# Patient Record
Sex: Female | Born: 1995
Health system: Southern US, Community
[De-identification: ages and names within clinical notes are randomized; demographics above are authoritative.]

## PROBLEM LIST (undated history)

## (undated) DIAGNOSIS — J020 Streptococcal pharyngitis: Secondary | ICD-10-CM

## (undated) DIAGNOSIS — S069X9A Unspecified intracranial injury with loss of consciousness of unspecified duration, initial encounter: Secondary | ICD-10-CM

## (undated) DIAGNOSIS — K219 Gastro-esophageal reflux disease without esophagitis: Secondary | ICD-10-CM

## (undated) DIAGNOSIS — F319 Bipolar disorder, unspecified: Secondary | ICD-10-CM

## (undated) DIAGNOSIS — F32A Depression, unspecified: Secondary | ICD-10-CM

## (undated) DIAGNOSIS — F419 Anxiety disorder, unspecified: Secondary | ICD-10-CM

## (undated) HISTORY — DX: Streptococcal pharyngitis: J02.0

## (undated) HISTORY — DX: Unspecified intracranial injury with loss of consciousness of unspecified duration, initial encounter: S06.9X9A

---
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
## 2016-01-04 ENCOUNTER — Other Ambulatory Visit: Payer: Self-pay | Admitting: Family Medicine

## 2016-01-08 ENCOUNTER — Ambulatory Visit
Admission: RE | Admit: 2016-01-08 | Discharge: 2016-01-08 | Disposition: A | Discharge status: Home / Self Care | Payer: Commercial Managed Care - HMO | Source: Ambulatory Visit | Attending: Family Medicine | Admitting: Family Medicine

## 2016-01-08 ENCOUNTER — Ambulatory Visit (INDEPENDENT_AMBULATORY_CARE_PROVIDER_SITE_OTHER): Payer: Commercial Managed Care - HMO | Admitting: Family Medicine

## 2016-01-08 ENCOUNTER — Encounter: Payer: Self-pay | Admitting: Family Medicine

## 2016-01-08 VITALS — BP 104/70 | HR 72 | Temp 98.7°F | Ht 69.0 in | Wt 144.4 lb

## 2016-01-08 DIAGNOSIS — N6320 Unspecified lump in the left breast, unspecified quadrant: Secondary | ICD-10-CM | POA: Insufficient documentation

## 2016-01-08 DIAGNOSIS — N63 Unspecified lump in breast: Secondary | ICD-10-CM | POA: Diagnosis not present

## 2016-01-08 NOTE — Assessment & Plan Note (Signed)
New.  Pt w/ firm, ~1 cm mobile lump in L breast at 12 o'clock at areolar margin.  Get imaging to assess.  Will determine next steps based on results.

## 2016-01-08 NOTE — Progress Notes (Signed)
   Subjective:    Patient ID: Kylie Osborn, female    DOB: Nov 05, 1996, 20 y.o.   MRN: [id number]  HPI Breast lump- L breast.  Noticed last week.  Not painful.  No color change to skin, no drainage.  + family history of breast cancer- maternal aunt (late 71s), paternal aunt (89s).   Review of Systems For ROS see HPI     Objective:   Physical Exam  Constitutional: She is oriented to person, place, and time. She appears well-developed and well-nourished. No distress.  HENT:  Head: Normocephalic and atraumatic.  Pulmonary/Chest: Right breast exhibits no inverted nipple, no mass, no nipple discharge, no skin change and no tenderness. Left breast exhibits mass (1cm firm but freely mobile mass at 12 o'clock at areolar border). Left breast exhibits no inverted nipple, no nipple discharge, no skin change and no tenderness.    Neurological: She is alert and oriented to person, place, and time.  Skin: Skin is warm and dry.  Psychiatric: She has a normal mood and affect. Her behavior is normal. Thought content normal.  Vitals reviewed.         Assessment & Plan:

## 2016-01-08 NOTE — Patient Instructions (Signed)
We'll notify you of your results as soon as we have them [facility] Call with any questions or concerns Hang in there!!

## 2016-02-01 ENCOUNTER — Ambulatory Visit (INDEPENDENT_AMBULATORY_CARE_PROVIDER_SITE_OTHER): Payer: Commercial Managed Care - HMO | Admitting: Family Medicine

## 2016-02-01 ENCOUNTER — Encounter: Payer: Self-pay | Admitting: Family Medicine

## 2016-02-01 VITALS — BP 100/68 | HR 68 | Temp 98.1°F | Resp 16 | Ht 69.0 in | Wt 143.4 lb

## 2016-02-01 DIAGNOSIS — Z0000 Encounter for general adult medical examination without abnormal findings: Secondary | ICD-10-CM | POA: Diagnosis not present

## 2016-02-01 DIAGNOSIS — Z202 Contact with and (suspected) exposure to infections with a predominantly sexual mode of transmission: Secondary | ICD-10-CM

## 2016-02-01 MED ORDER — NORGESTIMATE-ETH ESTRADIOL 0.25-35 MG-MCG PO TABS: 1.0000 | ORAL_TABLET | Freq: Every day | ORAL | Status: DC

## 2016-02-01 NOTE — Progress Notes (Signed)
Pre visit review using our clinic review tool, if applicable. No additional management support is needed unless otherwise documented below in the visit note. 

## 2016-02-01 NOTE — Assessment & Plan Note (Signed)
Check labs.  Treat prn.  Stressed need for safe sex.

## 2016-02-01 NOTE — Patient Instructions (Signed)
Follow up in 1 year or as needed We'll notify you of your lab results and make any changes if needed Keep up the good work!  You look great! Call with any questions or concerns If you want to join us at the new [place] office, any scheduled appointments will automatically transfer and we will see you at [address], [place], [place] [id number] (OPENING 3/23) Keep up the good work in school! Enjoy Spring Break!!! Happy [birth year] Birthday!!!

## 2016-02-01 NOTE — Assessment & Plan Note (Signed)
Pt's PE WNL.  UTD on immunizations.  Discussed safety at school and home.  Anticipatory guidance provided.

## 2016-02-01 NOTE — Progress Notes (Signed)
   Subjective:    Patient ID: Kylie Osborn, female    DOB: 02/22/1996, 20 y.o.   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  Getting good grades.  No current concerns.  Pt asking to be screened for STDs.     Review of Systems Patient reports no vision/ hearing changes, adenopathy,fever, weight change,  persistant/recurrent hoarseness , swallowing issues, chest pain, palpitations, edema, persistant/recurrent cough, hemoptysis, dyspnea (rest/exertional/paroxysmal nocturnal), gastrointestinal bleeding (melena, rectal bleeding), abdominal pain, significant heartburn, bowel changes, GU symptoms (dysuria, hematuria, incontinence), Gyn symptoms (abnormal  bleeding, pain),  syncope, focal weakness, memory loss, numbness & tingling, skin/hair/nail changes, abnormal bruising or bleeding, anxiety, or depression.     Objective:   Physical Exam General Appearance:    Alert, cooperative, no distress, appears stated age  Head:    Normocephalic, without obvious abnormality, atraumatic  Eyes:    PERRL, conjunctiva/corneas clear, EOM's intact, fundi    benign, both eyes  Ears:    Normal TM's and external ear canals, both ears  Nose:   Nares normal, septum midline, mucosa normal, no drainage    or sinus tenderness  Throat:   Lips, mucosa, and tongue normal; teeth and gums normal  Neck:   Supple, symmetrical, trachea midline, no adenopathy;    Thyroid: no enlargement/tenderness/nodules  Back:     Symmetric, no curvature, ROM normal, no CVA tenderness  Lungs:     Clear to auscultation bilaterally, respirations unlabored  Chest Wall:    No tenderness or deformity   Heart:    Regular rate and rhythm, S1 and S2 normal, no murmur, rub   or gallop  Breast Exam:    Deferred to GYN  Abdomen:     Soft, non-tender, bowel sounds active all four quadrants,    no masses, no organomegaly  Genitalia:    Deferred to GYN  Rectal:    Extremities:   Extremities normal, atraumatic, no cyanosis or edema  Pulses:   2+  and symmetric all extremities  Skin:   Skin color, texture, turgor normal, no rashes or lesions  Lymph nodes:   Cervical, supraclavicular, and axillary nodes normal  Neurologic:   CNII-XII intact, normal strength, sensation and reflexes    throughout          Assessment & Plan:

## 2016-02-02 LAB — HIV ANTIBODY (ROUTINE TESTING W REFLEX): HIV: NONREACTIVE

## 2016-02-02 LAB — GC/CHLAMYDIA PROBE AMP
CT PROBE, AMP APTIMA: NOT DETECTED
GC Probe RNA: NOT DETECTED

## 2016-02-02 LAB — RPR

## 2016-11-13 ENCOUNTER — Other Ambulatory Visit: Payer: Self-pay | Admitting: Family Medicine

## 2017-04-06 ENCOUNTER — Emergency Department (HOSPITAL_COMMUNITY): Payer: Commercial Managed Care - HMO

## 2017-04-06 ENCOUNTER — Encounter (HOSPITAL_COMMUNITY): Admission: EM | Disposition: A | Discharge status: Rehabilitation Facility | Payer: Self-pay | Source: Home / Self Care

## 2017-04-06 ENCOUNTER — Inpatient Hospital Stay (HOSPITAL_COMMUNITY): Payer: Commercial Managed Care - HMO

## 2017-04-06 ENCOUNTER — Inpatient Hospital Stay (HOSPITAL_COMMUNITY): Payer: Commercial Managed Care - HMO | Admitting: Certified Registered Nurse Anesthetist

## 2017-04-06 ENCOUNTER — Encounter (HOSPITAL_COMMUNITY): Payer: Self-pay | Admitting: Radiology

## 2017-04-06 ENCOUNTER — Inpatient Hospital Stay (HOSPITAL_COMMUNITY)
Admission: EM | Admit: 2017-04-06 | Discharge: 2017-04-18 | DRG: 003 | Disposition: A | Discharge status: Rehabilitation Facility | Payer: Commercial Managed Care - HMO | Attending: Orthopedic Surgery | Admitting: Orthopedic Surgery

## 2017-04-06 DIAGNOSIS — S52209A Unspecified fracture of shaft of unspecified ulna, initial encounter for closed fracture: Secondary | ICD-10-CM | POA: Diagnosis present

## 2017-04-06 DIAGNOSIS — T148XXA Other injury of unspecified body region, initial encounter: Secondary | ICD-10-CM

## 2017-04-06 DIAGNOSIS — R140 Abdominal distension (gaseous): Secondary | ICD-10-CM | POA: Diagnosis not present

## 2017-04-06 DIAGNOSIS — S064X0A Epidural hemorrhage without loss of consciousness, initial encounter: Secondary | ICD-10-CM | POA: Diagnosis not present

## 2017-04-06 DIAGNOSIS — R1310 Dysphagia, unspecified: Secondary | ICD-10-CM | POA: Diagnosis not present

## 2017-04-06 DIAGNOSIS — J9690 Respiratory failure, unspecified, unspecified whether with hypoxia or hypercapnia: Secondary | ICD-10-CM | POA: Diagnosis not present

## 2017-04-06 DIAGNOSIS — S62301D Unspecified fracture of second metacarpal bone, left hand, subsequent encounter for fracture with routine healing: Secondary | ICD-10-CM | POA: Diagnosis not present

## 2017-04-06 DIAGNOSIS — S199XXA Unspecified injury of neck, initial encounter: Secondary | ICD-10-CM | POA: Diagnosis not present

## 2017-04-06 DIAGNOSIS — J9601 Acute respiratory failure with hypoxia: Secondary | ICD-10-CM | POA: Diagnosis present

## 2017-04-06 DIAGNOSIS — S62307A Unspecified fracture of fifth metacarpal bone, left hand, initial encounter for closed fracture: Secondary | ICD-10-CM | POA: Diagnosis present

## 2017-04-06 DIAGNOSIS — S61411A Laceration without foreign body of right hand, initial encounter: Secondary | ICD-10-CM | POA: Diagnosis not present

## 2017-04-06 DIAGNOSIS — D62 Acute posthemorrhagic anemia: Secondary | ICD-10-CM | POA: Diagnosis not present

## 2017-04-06 DIAGNOSIS — S12201A Unspecified nondisplaced fracture of third cervical vertebra, initial encounter for closed fracture: Secondary | ICD-10-CM | POA: Diagnosis not present

## 2017-04-06 DIAGNOSIS — S129XXA Fracture of neck, unspecified, initial encounter: Secondary | ICD-10-CM

## 2017-04-06 DIAGNOSIS — S0101XA Laceration without foreign body of scalp, initial encounter: Secondary | ICD-10-CM

## 2017-04-06 DIAGNOSIS — R0902 Hypoxemia: Secondary | ICD-10-CM | POA: Diagnosis not present

## 2017-04-06 DIAGNOSIS — S6292XA Unspecified fracture of left wrist and hand, initial encounter for closed fracture: Secondary | ICD-10-CM | POA: Diagnosis not present

## 2017-04-06 DIAGNOSIS — I809 Phlebitis and thrombophlebitis of unspecified site: Secondary | ICD-10-CM | POA: Diagnosis not present

## 2017-04-06 DIAGNOSIS — S22029A Unspecified fracture of second thoracic vertebra, initial encounter for closed fracture: Secondary | ICD-10-CM | POA: Diagnosis not present

## 2017-04-06 DIAGNOSIS — S12290A Other displaced fracture of third cervical vertebra, initial encounter for closed fracture: Secondary | ICD-10-CM | POA: Diagnosis not present

## 2017-04-06 DIAGNOSIS — S12190A Other displaced fracture of second cervical vertebra, initial encounter for closed fracture: Secondary | ICD-10-CM | POA: Diagnosis not present

## 2017-04-06 DIAGNOSIS — S62231A Other displaced fracture of base of first metacarpal bone, right hand, initial encounter for closed fracture: Secondary | ICD-10-CM | POA: Diagnosis not present

## 2017-04-06 DIAGNOSIS — M79641 Pain in right hand: Secondary | ICD-10-CM | POA: Diagnosis not present

## 2017-04-06 DIAGNOSIS — S270XXA Traumatic pneumothorax, initial encounter: Secondary | ICD-10-CM | POA: Diagnosis present

## 2017-04-06 DIAGNOSIS — F419 Anxiety disorder, unspecified: Secondary | ICD-10-CM | POA: Diagnosis present

## 2017-04-06 DIAGNOSIS — J939 Pneumothorax, unspecified: Secondary | ICD-10-CM

## 2017-04-06 DIAGNOSIS — S62317D Displaced fracture of base of fifth metacarpal bone. left hand, subsequent encounter for fracture with routine healing: Secondary | ICD-10-CM | POA: Diagnosis not present

## 2017-04-06 DIAGNOSIS — S6291XA Unspecified fracture of right wrist and hand, initial encounter for closed fracture: Secondary | ICD-10-CM

## 2017-04-06 DIAGNOSIS — Z4682 Encounter for fitting and adjustment of non-vascular catheter: Secondary | ICD-10-CM | POA: Diagnosis not present

## 2017-04-06 DIAGNOSIS — S3993XA Unspecified injury of pelvis, initial encounter: Secondary | ICD-10-CM | POA: Diagnosis not present

## 2017-04-06 DIAGNOSIS — J398 Other specified diseases of upper respiratory tract: Secondary | ICD-10-CM

## 2017-04-06 DIAGNOSIS — J9811 Atelectasis: Secondary | ICD-10-CM

## 2017-04-06 DIAGNOSIS — T1490XA Injury, unspecified, initial encounter: Secondary | ICD-10-CM

## 2017-04-06 DIAGNOSIS — S0091XA Abrasion of unspecified part of head, initial encounter: Secondary | ICD-10-CM | POA: Diagnosis not present

## 2017-04-06 DIAGNOSIS — M4322 Fusion of spine, cervical region: Secondary | ICD-10-CM | POA: Diagnosis not present

## 2017-04-06 DIAGNOSIS — N6320 Unspecified lump in the left breast, unspecified quadrant: Secondary | ICD-10-CM | POA: Diagnosis not present

## 2017-04-06 DIAGNOSIS — S27321A Contusion of lung, unilateral, initial encounter: Secondary | ICD-10-CM | POA: Diagnosis not present

## 2017-04-06 DIAGNOSIS — S299XXA Unspecified injury of thorax, initial encounter: Secondary | ICD-10-CM

## 2017-04-06 DIAGNOSIS — S14102A Unspecified injury at C2 level of cervical spinal cord, initial encounter: Secondary | ICD-10-CM | POA: Diagnosis not present

## 2017-04-06 DIAGNOSIS — S63056A Dislocation of other carpometacarpal joint of unspecified hand, initial encounter: Secondary | ICD-10-CM

## 2017-04-06 DIAGNOSIS — S2232XA Fracture of one rib, left side, initial encounter for closed fracture: Secondary | ICD-10-CM | POA: Diagnosis not present

## 2017-04-06 DIAGNOSIS — J9600 Acute respiratory failure, unspecified whether with hypoxia or hypercapnia: Secondary | ICD-10-CM | POA: Diagnosis not present

## 2017-04-06 DIAGNOSIS — S15101A Unspecified injury of right vertebral artery, initial encounter: Secondary | ICD-10-CM | POA: Diagnosis not present

## 2017-04-06 DIAGNOSIS — J020 Streptococcal pharyngitis: Secondary | ICD-10-CM | POA: Diagnosis not present

## 2017-04-06 DIAGNOSIS — S12200D Unspecified displaced fracture of third cervical vertebra, subsequent encounter for fracture with routine healing: Secondary | ICD-10-CM | POA: Diagnosis not present

## 2017-04-06 DIAGNOSIS — M795 Residual foreign body in soft tissue: Secondary | ICD-10-CM | POA: Diagnosis not present

## 2017-04-06 DIAGNOSIS — R918 Other nonspecific abnormal finding of lung field: Secondary | ICD-10-CM | POA: Diagnosis not present

## 2017-04-06 DIAGNOSIS — R102 Pelvic and perineal pain: Secondary | ICD-10-CM | POA: Diagnosis not present

## 2017-04-06 DIAGNOSIS — S0102XA Laceration with foreign body of scalp, initial encounter: Secondary | ICD-10-CM | POA: Diagnosis not present

## 2017-04-06 DIAGNOSIS — S62308A Unspecified fracture of other metacarpal bone, initial encounter for closed fracture: Secondary | ICD-10-CM | POA: Diagnosis present

## 2017-04-06 DIAGNOSIS — S52502A Unspecified fracture of the lower end of left radius, initial encounter for closed fracture: Secondary | ICD-10-CM | POA: Diagnosis not present

## 2017-04-06 DIAGNOSIS — S0990XA Unspecified injury of head, initial encounter: Secondary | ICD-10-CM | POA: Diagnosis not present

## 2017-04-06 DIAGNOSIS — G8250 Quadriplegia, unspecified: Secondary | ICD-10-CM | POA: Diagnosis present

## 2017-04-06 DIAGNOSIS — S3992XA Unspecified injury of lower back, initial encounter: Secondary | ICD-10-CM | POA: Diagnosis not present

## 2017-04-06 DIAGNOSIS — S3991XA Unspecified injury of abdomen, initial encounter: Secondary | ICD-10-CM | POA: Diagnosis not present

## 2017-04-06 DIAGNOSIS — R0603 Acute respiratory distress: Secondary | ICD-10-CM | POA: Diagnosis not present

## 2017-04-06 DIAGNOSIS — S63251A Unspecified dislocation of left index finger, initial encounter: Secondary | ICD-10-CM | POA: Diagnosis not present

## 2017-04-06 DIAGNOSIS — S51812A Laceration without foreign body of left forearm, initial encounter: Secondary | ICD-10-CM | POA: Diagnosis present

## 2017-04-06 DIAGNOSIS — S069X9A Unspecified intracranial injury with loss of consciousness of unspecified duration, initial encounter: Secondary | ICD-10-CM

## 2017-04-06 DIAGNOSIS — S61412A Laceration without foreign body of left hand, initial encounter: Secondary | ICD-10-CM | POA: Diagnosis not present

## 2017-04-06 DIAGNOSIS — R40242 Glasgow coma scale score 9-12, unspecified time: Secondary | ICD-10-CM | POA: Diagnosis present

## 2017-04-06 DIAGNOSIS — S5292XD Unspecified fracture of left forearm, subsequent encounter for closed fracture with routine healing: Secondary | ICD-10-CM | POA: Diagnosis not present

## 2017-04-06 DIAGNOSIS — S12100A Unspecified displaced fracture of second cervical vertebra, initial encounter for closed fracture: Secondary | ICD-10-CM | POA: Diagnosis not present

## 2017-04-06 DIAGNOSIS — G8252 Quadriplegia, C1-C4 incomplete: Secondary | ICD-10-CM | POA: Diagnosis not present

## 2017-04-06 DIAGNOSIS — S069XAA Unspecified intracranial injury with loss of consciousness status unknown, initial encounter: Secondary | ICD-10-CM

## 2017-04-06 DIAGNOSIS — J90 Pleural effusion, not elsewhere classified: Secondary | ICD-10-CM | POA: Diagnosis not present

## 2017-04-06 DIAGNOSIS — J189 Pneumonia, unspecified organism: Secondary | ICD-10-CM | POA: Diagnosis not present

## 2017-04-06 DIAGNOSIS — Y92410 Unspecified street and highway as the place of occurrence of the external cause: Secondary | ICD-10-CM | POA: Diagnosis not present

## 2017-04-06 DIAGNOSIS — Z930 Tracheostomy status: Secondary | ICD-10-CM

## 2017-04-06 DIAGNOSIS — G839 Paralytic syndrome, unspecified: Secondary | ICD-10-CM

## 2017-04-06 DIAGNOSIS — S62305D Unspecified fracture of fourth metacarpal bone, left hand, subsequent encounter for fracture with routine healing: Secondary | ICD-10-CM | POA: Diagnosis not present

## 2017-04-06 DIAGNOSIS — S2242XA Multiple fractures of ribs, left side, initial encounter for closed fracture: Secondary | ICD-10-CM | POA: Diagnosis not present

## 2017-04-06 DIAGNOSIS — N63 Unspecified lump in unspecified breast: Secondary | ICD-10-CM | POA: Diagnosis not present

## 2017-04-06 DIAGNOSIS — S098XXA Other specified injuries of head, initial encounter: Secondary | ICD-10-CM | POA: Diagnosis not present

## 2017-04-06 DIAGNOSIS — Z136 Encounter for screening for cardiovascular disorders: Secondary | ICD-10-CM | POA: Diagnosis not present

## 2017-04-06 DIAGNOSIS — S62316A Displaced fracture of base of fifth metacarpal bone, right hand, initial encounter for closed fracture: Secondary | ICD-10-CM | POA: Diagnosis not present

## 2017-04-06 DIAGNOSIS — S6991XA Unspecified injury of right wrist, hand and finger(s), initial encounter: Secondary | ICD-10-CM | POA: Diagnosis not present

## 2017-04-06 DIAGNOSIS — S63253A Unspecified dislocation of left middle finger, initial encounter: Secondary | ICD-10-CM | POA: Diagnosis not present

## 2017-04-06 DIAGNOSIS — Z9911 Dependence on respirator [ventilator] status: Secondary | ICD-10-CM | POA: Diagnosis not present

## 2017-04-06 DIAGNOSIS — S5291XA Unspecified fracture of right forearm, initial encounter for closed fracture: Secondary | ICD-10-CM

## 2017-04-06 DIAGNOSIS — S52302E Unspecified fracture of shaft of left radius, subsequent encounter for open fracture type I or II with routine healing: Secondary | ICD-10-CM | POA: Diagnosis not present

## 2017-04-06 DIAGNOSIS — S63055A Dislocation of other carpometacarpal joint of left hand, initial encounter: Secondary | ICD-10-CM | POA: Diagnosis not present

## 2017-04-06 DIAGNOSIS — R093 Abnormal sputum: Secondary | ICD-10-CM | POA: Diagnosis not present

## 2017-04-06 DIAGNOSIS — Z419 Encounter for procedure for purposes other than remedying health state, unspecified: Secondary | ICD-10-CM

## 2017-04-06 DIAGNOSIS — S129XXD Fracture of neck, unspecified, subsequent encounter: Secondary | ICD-10-CM | POA: Diagnosis not present

## 2017-04-06 DIAGNOSIS — I739 Peripheral vascular disease, unspecified: Secondary | ICD-10-CM | POA: Diagnosis not present

## 2017-04-06 DIAGNOSIS — S52202D Unspecified fracture of shaft of left ulna, subsequent encounter for closed fracture with routine healing: Secondary | ICD-10-CM | POA: Diagnosis not present

## 2017-04-06 DIAGNOSIS — Z9889 Other specified postprocedural states: Secondary | ICD-10-CM

## 2017-04-06 DIAGNOSIS — S065X9A Traumatic subdural hemorrhage with loss of consciousness of unspecified duration, initial encounter: Secondary | ICD-10-CM | POA: Diagnosis not present

## 2017-04-06 DIAGNOSIS — S13141A Dislocation of C3/C4 cervical vertebrae, initial encounter: Secondary | ICD-10-CM | POA: Diagnosis not present

## 2017-04-06 DIAGNOSIS — S12200A Unspecified displaced fracture of third cervical vertebra, initial encounter for closed fracture: Secondary | ICD-10-CM | POA: Diagnosis not present

## 2017-04-06 DIAGNOSIS — S62317A Displaced fracture of base of fifth metacarpal bone. left hand, initial encounter for closed fracture: Secondary | ICD-10-CM | POA: Diagnosis not present

## 2017-04-06 HISTORY — PX: POSTERIOR CERVICAL FUSION/FORAMINOTOMY: SHX5038

## 2017-04-06 HISTORY — DX: Unspecified intracranial injury with loss of consciousness status unknown, initial encounter: S06.9XAA

## 2017-04-06 HISTORY — DX: Unspecified intracranial injury with loss of consciousness of unspecified duration, initial encounter: S06.9X9A

## 2017-04-06 LAB — URINALYSIS, ROUTINE W REFLEX MICROSCOPIC
BACTERIA UA: NONE SEEN
BILIRUBIN URINE: NEGATIVE
Glucose, UA: NEGATIVE mg/dL
Ketones, ur: NEGATIVE mg/dL
LEUKOCYTES UA: NEGATIVE
NITRITE: NEGATIVE
Protein, ur: NEGATIVE mg/dL
SPECIFIC GRAVITY, URINE: 1.044 — AB (ref 1.005–1.030)
pH: 6 (ref 5.0–8.0)

## 2017-04-06 LAB — COMPREHENSIVE METABOLIC PANEL
ALK PHOS: 65 U/L (ref 38–126)
ALT: 39 U/L (ref 14–54)
AST: 83 U/L — ABNORMAL HIGH (ref 15–41)
Albumin: 3.4 g/dL — ABNORMAL LOW (ref 3.5–5.0)
Anion gap: 11 (ref 5–15)
BUN: 12 mg/dL (ref 6–20)
CO2: 18 mmol/L — ABNORMAL LOW (ref 22–32)
Calcium: 8.7 mg/dL — ABNORMAL LOW (ref 8.9–10.3)
Chloride: 108 mmol/L (ref 101–111)
Creatinine, Ser: 1.16 mg/dL — ABNORMAL HIGH (ref 0.44–1.00)
Glucose, Bld: 154 mg/dL — ABNORMAL HIGH (ref 65–99)
POTASSIUM: 4 mmol/L (ref 3.5–5.1)
SODIUM: 137 mmol/L (ref 135–145)
Total Bilirubin: 0.3 mg/dL (ref 0.3–1.2)
Total Protein: 6.4 g/dL — ABNORMAL LOW (ref 6.5–8.1)

## 2017-04-06 LAB — I-STAT BETA HCG BLOOD, ED (MC, WL, AP ONLY)

## 2017-04-06 LAB — PREPARE FRESH FROZEN PLASMA
UNIT DIVISION: 0
Unit division: 0

## 2017-04-06 LAB — CBC
HEMATOCRIT: 37.7 % (ref 36.0–46.0)
HEMATOCRIT: 31 % — AB (ref 36.0–46.0)
HEMOGLOBIN: 12.4 g/dL (ref 12.0–15.0)
Hemoglobin: 10.8 g/dL — ABNORMAL LOW (ref 12.0–15.0)
MCH: 33.3 pg (ref 26.0–34.0)
MCH: 32.6 pg (ref 26.0–34.0)
MCHC: 32.9 g/dL (ref 30.0–36.0)
MCHC: 34.8 g/dL (ref 30.0–36.0)
MCV: 101.3 fL — AB (ref 78.0–100.0)
MCV: 93.7 fL (ref 78.0–100.0)
Platelets: 285 10*3/uL (ref 150–400)
Platelets: 162 10*3/uL (ref 150–400)
RBC: 3.72 MIL/uL — AB (ref 3.87–5.11)
RBC: 3.31 MIL/uL — ABNORMAL LOW (ref 3.87–5.11)
RDW: 13 % (ref 11.5–15.5)
RDW: 15.2 % (ref 11.5–15.5)
WBC: 11.1 10*3/uL — AB (ref 4.0–10.5)
WBC: 12.8 10*3/uL — AB (ref 4.0–10.5)

## 2017-04-06 LAB — I-STAT CHEM 8, ED
BUN: 14 mg/dL (ref 6–20)
CALCIUM ION: 1.1 mmol/L — AB (ref 1.15–1.40)
CHLORIDE: 108 mmol/L (ref 101–111)
CREATININE: 1 mg/dL (ref 0.44–1.00)
GLUCOSE: 145 mg/dL — AB (ref 65–99)
HCT: 38 % (ref 36.0–46.0)
HEMOGLOBIN: 12.9 g/dL (ref 12.0–15.0)
Potassium: 4 mmol/L (ref 3.5–5.1)
Sodium: 139 mmol/L (ref 135–145)
TCO2: 21 mmol/L (ref 0–100)

## 2017-04-06 LAB — PROTIME-INR
INR: 0.98
INR: 1.1
Prothrombin Time: 13 seconds (ref 11.4–15.2)
Prothrombin Time: 14.3 seconds (ref 11.4–15.2)

## 2017-04-06 LAB — MRSA PCR SCREENING: MRSA BY PCR: NEGATIVE

## 2017-04-06 LAB — BPAM FFP
Blood Product Expiration Date: 201805312359
Blood Product Expiration Date: 201805312359
ISSUE DATE / TIME: 201805130814
ISSUE DATE / TIME: 201805130814
UNIT TYPE AND RH: 6200
Unit Type and Rh: 6200

## 2017-04-06 LAB — POCT I-STAT 7, (LYTES, BLD GAS, ICA,H+H)
Acid-base deficit: 9 mmol/L — ABNORMAL HIGH (ref 0.0–2.0)
Bicarbonate: 17.2 mmol/L — ABNORMAL LOW (ref 20.0–28.0)
Calcium, Ion: 1.02 mmol/L — ABNORMAL LOW (ref 1.15–1.40)
HEMATOCRIT: 22 % — AB (ref 36.0–46.0)
HEMOGLOBIN: 7.5 g/dL — AB (ref 12.0–15.0)
O2 SAT: 100 %
PCO2 ART: 33.1 mmHg (ref 32.0–48.0)
PO2 ART: 297 mmHg — AB (ref 83.0–108.0)
POTASSIUM: 3.5 mmol/L (ref 3.5–5.1)
Sodium: 144 mmol/L (ref 135–145)
TCO2: 18 mmol/L (ref 0–100)
pH, Arterial: 7.312 — ABNORMAL LOW (ref 7.350–7.450)

## 2017-04-06 LAB — PREPARE RBC (CROSSMATCH)

## 2017-04-06 LAB — ETHANOL: Alcohol, Ethyl (B): <5 mg/dL (ref <5)

## 2017-04-06 LAB — CDS SEROLOGY

## 2017-04-06 LAB — ABO/RH: ABO/RH(D): O POS

## 2017-04-06 LAB — I-STAT CG4 LACTIC ACID, ED: LACTIC ACID, VENOUS: 2.4 mmol/L — AB (ref 0.5–1.9)

## 2017-04-06 SURGERY — POSTERIOR CERVICAL FUSION/FORAMINOTOMY LEVEL 2
Anesthesia: General | Site: Neck

## 2017-04-06 MED ORDER — IOPAMIDOL (ISOVUE-300) INJECTION 61%
INTRAVENOUS | Status: AC
  Administered 2017-04-06: 100 mL
  Filled 2017-04-06: qty 100

## 2017-04-06 MED ORDER — ORAL CARE MOUTH RINSE
15.0000 mL | Freq: Four times a day (QID) | OROMUCOSAL | Status: DC
  Administered 2017-04-06 – 2017-04-09 (×11): 15 mL via OROMUCOSAL

## 2017-04-06 MED ORDER — FENTANYL CITRATE (PF) 100 MCG/2ML IJ SOLN
INTRAMUSCULAR | Status: DC | PRN
  Administered 2017-04-06 (×5): 50 ug via INTRAVENOUS

## 2017-04-06 MED ORDER — 0.9 % SODIUM CHLORIDE (POUR BTL) OPTIME
TOPICAL | Status: DC | PRN
  Administered 2017-04-06 (×2): 1000 mL

## 2017-04-06 MED ORDER — FENTANYL CITRATE (PF) 100 MCG/2ML IJ SOLN
INTRAMUSCULAR | Status: AC | PRN
  Administered 2017-04-06: 50 ug via INTRAVENOUS

## 2017-04-06 MED ORDER — FENTANYL CITRATE (PF) 100 MCG/2ML IJ SOLN
100.0000 ug | INTRAMUSCULAR | Status: DC | PRN
  Administered 2017-04-06 – 2017-04-09 (×16): 100 ug via INTRAVENOUS
  Administered 2017-04-09: 50 ug via INTRAVENOUS
  Administered 2017-04-09: 100 ug via INTRAVENOUS
  Filled 2017-04-06 (×19): qty 2

## 2017-04-06 MED ORDER — CEFAZOLIN SODIUM-DEXTROSE 2-4 GM/100ML-% IV SOLN: 2.0000 g | INTRAVENOUS | Status: DC

## 2017-04-06 MED ORDER — MIDAZOLAM HCL 5 MG/5ML IJ SOLN
INTRAMUSCULAR | Status: DC | PRN
  Administered 2017-04-06 (×2): 1 mg via INTRAVENOUS

## 2017-04-06 MED ORDER — CEFAZOLIN SODIUM-DEXTROSE 2-4 GM/100ML-% IV SOLN
2.0000 g | Freq: Once | INTRAVENOUS | Status: AC
  Administered 2017-04-06: 2 g via INTRAVENOUS
  Filled 2017-04-06: qty 100

## 2017-04-06 MED ORDER — CEFAZOLIN SODIUM-DEXTROSE 2-4 GM/100ML-% IV SOLN
2.0000 g | INTRAVENOUS | Status: DC
  Filled 2017-04-06: qty 100

## 2017-04-06 MED ORDER — PANTOPRAZOLE SODIUM 40 MG IV SOLR
40.0000 mg | Freq: Every day | INTRAVENOUS | Status: DC
  Administered 2017-04-09 – 2017-04-17 (×7): 40 mg via INTRAVENOUS
  Filled 2017-04-06 (×8): qty 40

## 2017-04-06 MED ORDER — GADOBENATE DIMEGLUMINE 529 MG/ML IV SOLN
15.0000 mL | Freq: Once | INTRAVENOUS | Status: AC | PRN
  Administered 2017-04-06: 15 mL via INTRAVENOUS

## 2017-04-06 MED ORDER — FENTANYL CITRATE (PF) 100 MCG/2ML IJ SOLN
INTRAMUSCULAR | Status: AC
  Administered 2017-04-06: 12:00:00
  Filled 2017-04-06: qty 2

## 2017-04-06 MED ORDER — IOPAMIDOL (ISOVUE-370) INJECTION 76%
INTRAVENOUS | Status: AC
  Administered 2017-04-06: 50 mL
  Filled 2017-04-06: qty 50

## 2017-04-06 MED ORDER — PROPOFOL 1000 MG/100ML IV EMUL
INTRAVENOUS | Status: AC | PRN
  Administered 2017-04-06: 5 ug/kg/min via INTRAVENOUS

## 2017-04-06 MED ORDER — BACITRACIN ZINC 500 UNIT/GM EX OINT
TOPICAL_OINTMENT | CUTANEOUS | Status: DC | PRN
  Administered 2017-04-06: 1 via TOPICAL

## 2017-04-06 MED ORDER — CHLORHEXIDINE GLUCONATE 4 % EX LIQD
60.0000 mL | Freq: Once | CUTANEOUS | Status: AC
  Administered 2017-04-06: 4 via TOPICAL
  Filled 2017-04-06: qty 60

## 2017-04-06 MED ORDER — PROPOFOL 10 MG/ML IV BOLUS
INTRAVENOUS | Status: AC
  Filled 2017-04-06: qty 20

## 2017-04-06 MED ORDER — VANCOMYCIN HCL 1000 MG IV SOLR
INTRAVENOUS | Status: DC | PRN
  Administered 2017-04-06: 1000 mg

## 2017-04-06 MED ORDER — PANTOPRAZOLE SODIUM 40 MG PO TBEC
40.0000 mg | DELAYED_RELEASE_TABLET | Freq: Every day | ORAL | Status: DC
  Administered 2017-04-07 – 2017-04-15 (×4): 40 mg via ORAL
  Filled 2017-04-06 (×4): qty 1

## 2017-04-06 MED ORDER — SODIUM CHLORIDE 0.9 % IV SOLN
INTRAVENOUS | Status: DC | PRN
  Administered 2017-04-06: 17:00:00 via INTRAVENOUS

## 2017-04-06 MED ORDER — FENTANYL CITRATE (PF) 100 MCG/2ML IJ SOLN
INTRAMUSCULAR | Status: AC
  Filled 2017-04-06: qty 2

## 2017-04-06 MED ORDER — ROCURONIUM BROMIDE 100 MG/10ML IV SOLN
INTRAVENOUS | Status: DC | PRN
  Administered 2017-04-06 (×2): 50 mg via INTRAVENOUS
  Administered 2017-04-06: 30 mg via INTRAVENOUS
  Administered 2017-04-06: 20 mg via INTRAVENOUS

## 2017-04-06 MED ORDER — CEFAZOLIN SODIUM 1 G IJ SOLR
INTRAMUSCULAR | Status: DC | PRN
  Administered 2017-04-06: 2 g via INTRAMUSCULAR

## 2017-04-06 MED ORDER — ASPIRIN 325 MG PO TABS
325.0000 mg | ORAL_TABLET | Freq: Every day | ORAL | Status: DC
  Administered 2017-04-07 – 2017-04-17 (×7): 325 mg via ORAL
  Filled 2017-04-06 (×9): qty 1

## 2017-04-06 MED ORDER — THROMBIN 5000 UNITS EX SOLR
CUTANEOUS | Status: AC
  Filled 2017-04-06: qty 5000

## 2017-04-06 MED ORDER — ROCURONIUM BROMIDE 50 MG/5ML IV SOLN
INTRAVENOUS | Status: AC | PRN
  Administered 2017-04-06: 60 mg via INTRAVENOUS

## 2017-04-06 MED ORDER — PROPOFOL 1000 MG/100ML IV EMUL
INTRAVENOUS | Status: AC
  Filled 2017-04-06: qty 100

## 2017-04-06 MED ORDER — THROMBIN 20000 UNITS EX SOLR
CUTANEOUS | Status: AC
  Filled 2017-04-06: qty 20000

## 2017-04-06 MED ORDER — SODIUM CHLORIDE 0.9 % IV SOLN
0.0000 ug/min | INTRAVENOUS | Status: DC
  Administered 2017-04-06: 20 ug/min via INTRAVENOUS
  Filled 2017-04-06 (×2): qty 1

## 2017-04-06 MED ORDER — BACITRACIN ZINC 500 UNIT/GM EX OINT
TOPICAL_OINTMENT | CUTANEOUS | Status: AC
  Filled 2017-04-06: qty 28.35

## 2017-04-06 MED ORDER — BUPIVACAINE HCL (PF) 0.5 % IJ SOLN
INTRAMUSCULAR | Status: DC | PRN
  Administered 2017-04-06: 8 mL

## 2017-04-06 MED ORDER — MIDAZOLAM HCL 2 MG/2ML IJ SOLN
INTRAMUSCULAR | Status: AC
  Filled 2017-04-06: qty 2

## 2017-04-06 MED ORDER — TETANUS-DIPHTH-ACELL PERTUSSIS 5-2.5-18.5 LF-MCG/0.5 IM SUSP
0.5000 mL | Freq: Once | INTRAMUSCULAR | Status: AC
  Administered 2017-04-06: 0.5 mL via INTRAMUSCULAR
  Filled 2017-04-06: qty 0.5

## 2017-04-06 MED ORDER — LIDOCAINE-EPINEPHRINE 2 %-1:100000 IJ SOLN
INTRAMUSCULAR | Status: DC | PRN
  Administered 2017-04-06: 8 mL via INTRADERMAL

## 2017-04-06 MED ORDER — BUPIVACAINE LIPOSOME 1.3 % IJ SUSP
20.0000 mL | Freq: Once | INTRAMUSCULAR | Status: DC
  Filled 2017-04-06: qty 20

## 2017-04-06 MED ORDER — BUPIVACAINE LIPOSOME 1.3 % IJ SUSP
INTRAMUSCULAR | Status: DC | PRN
  Administered 2017-04-06: 20 mL

## 2017-04-06 MED ORDER — GELATIN ABSORBABLE MT POWD
OROMUCOSAL | Status: DC | PRN
  Administered 2017-04-06: 5 mL via TOPICAL

## 2017-04-06 MED ORDER — CHLORHEXIDINE GLUCONATE 0.12% ORAL RINSE (MEDLINE KIT)
15.0000 mL | Freq: Two times a day (BID) | OROMUCOSAL | Status: DC
  Administered 2017-04-07 – 2017-04-09 (×5): 15 mL via OROMUCOSAL

## 2017-04-06 MED ORDER — FENTANYL CITRATE (PF) 250 MCG/5ML IJ SOLN
INTRAMUSCULAR | Status: AC
  Filled 2017-04-06: qty 5

## 2017-04-06 MED ORDER — SODIUM CHLORIDE 0.9 % IV SOLN
INTRAVENOUS | Status: DC
  Administered 2017-04-06 – 2017-04-07 (×3): via INTRAVENOUS
  Administered 2017-04-07: 125 mL/h via INTRAVENOUS
  Administered 2017-04-08 – 2017-04-11 (×6): via INTRAVENOUS
  Administered 2017-04-11: 1000 mL via INTRAVENOUS
  Administered 2017-04-12 – 2017-04-17 (×8): via INTRAVENOUS

## 2017-04-06 MED ORDER — VANCOMYCIN HCL 1000 MG IV SOLR
INTRAVENOUS | Status: AC
  Filled 2017-04-06: qty 1000

## 2017-04-06 MED ORDER — ETOMIDATE 2 MG/ML IV SOLN
INTRAVENOUS | Status: AC | PRN
  Administered 2017-04-06 (×2): 10 mg via INTRAVENOUS

## 2017-04-06 MED ORDER — SUCCINYLCHOLINE CHLORIDE 20 MG/ML IJ SOLN
INTRAMUSCULAR | Status: AC | PRN
  Administered 2017-04-06: 150 mg via INTRAVENOUS

## 2017-04-06 MED ORDER — NOREPINEPHRINE BITARTRATE 1 MG/ML IV SOLN
0.0000 ug/min | INTRAVENOUS | Status: AC
  Filled 2017-04-06: qty 4

## 2017-04-06 MED ORDER — ASPIRIN 300 MG RE SUPP
600.0000 mg | RECTAL | Status: AC
  Administered 2017-04-06: 600 mg via RECTAL
  Filled 2017-04-06: qty 2

## 2017-04-06 MED ORDER — THROMBIN 20000 UNITS EX SOLR
CUTANEOUS | Status: DC | PRN
  Administered 2017-04-06: 20 mL via TOPICAL

## 2017-04-06 MED ORDER — PROPOFOL 1000 MG/100ML IV EMUL
0.0000 ug/kg/min | INTRAVENOUS | Status: DC
  Administered 2017-04-06 – 2017-04-08 (×7): 30 ug/kg/min via INTRAVENOUS
  Filled 2017-04-06 (×6): qty 100

## 2017-04-06 MED ORDER — SODIUM CHLORIDE 0.9 % IV SOLN
INTRAVENOUS | Status: AC | PRN
  Administered 2017-04-06: 1000 mL via INTRAVENOUS
  Administered 2017-04-06: 125 mL/h via INTRAVENOUS
  Administered 2017-04-06: 1000 mL via INTRAVENOUS

## 2017-04-06 MED ORDER — CEFAZOLIN SODIUM 1 G IJ SOLR
INTRAMUSCULAR | Status: AC
  Filled 2017-04-06: qty 20

## 2017-04-06 MED ORDER — LACTATED RINGERS IV SOLN: INTRAVENOUS | Status: DC

## 2017-04-06 MED ORDER — LIDOCAINE-EPINEPHRINE 2 %-1:100000 IJ SOLN
INTRAMUSCULAR | Status: AC
  Filled 2017-04-06: qty 1

## 2017-04-06 MED ORDER — SODIUM CHLORIDE 0.9 % IR SOLN
Status: DC | PRN
  Administered 2017-04-06 (×3): 500 mL

## 2017-04-06 MED ORDER — SODIUM CHLORIDE 0.9 % IV SOLN: Freq: Once | INTRAVENOUS | Status: AC

## 2017-04-06 MED ORDER — SENNOSIDES 8.8 MG/5ML PO SYRP
5.0000 mL | ORAL_SOLUTION | Freq: Two times a day (BID) | ORAL | Status: DC | PRN
  Administered 2017-04-14 – 2017-04-17 (×5): 5 mL
  Filled 2017-04-06 (×5): qty 5

## 2017-04-06 MED ORDER — SODIUM CHLORIDE 0.9 % IV BOLUS (SEPSIS)
1000.0000 mL | Freq: Once | INTRAVENOUS | Status: AC
  Administered 2017-04-06: 1000 mL via INTRAVENOUS

## 2017-04-06 MED ORDER — BUPIVACAINE HCL (PF) 0.5 % IJ SOLN
INTRAMUSCULAR | Status: AC
  Filled 2017-04-06: qty 30

## 2017-04-06 MED ORDER — ACETAMINOPHEN 500 MG PO TABS: 1000.0000 mg | ORAL_TABLET | Freq: Once | ORAL | Status: DC

## 2017-04-06 SURGICAL SUPPLY — 87 items
ADH SKN CLS APL DERMABOND .7 (GAUZE/BANDAGES/DRESSINGS) ×1
APL SKNCLS STERI-STRIP NONHPOA (GAUZE/BANDAGES/DRESSINGS)
BAG DECANTER FOR FLEXI CONT (MISCELLANEOUS) ×2 IMPLANT
BENZOIN TINCTURE PRP APPL 2/3 (GAUZE/BANDAGES/DRESSINGS) IMPLANT
BLADE CLIPPER SURG (BLADE) IMPLANT
BLADE ULTRA TIP 2M (BLADE) IMPLANT
BNDG GAUZE ELAST 4 BULKY (GAUZE/BANDAGES/DRESSINGS) ×4 IMPLANT
BONE EQUIVA 10CC (Bone Implant) ×2 IMPLANT
BUR MATCHSTICK NEURO 3.0 LAGG (BURR) IMPLANT
BUR PRECISION FLUTE 5.0 (BURR) IMPLANT
CANISTER SUCT 3000ML PPV (MISCELLANEOUS) ×2 IMPLANT
CAP CLSR POST CERV (Cap) ×12 IMPLANT
CARTRIDGE OIL MAESTRO DRILL (MISCELLANEOUS) ×1 IMPLANT
CHLORAPREP W/TINT 26ML (MISCELLANEOUS) ×2 IMPLANT
COVER BACK TABLE 60X90IN (DRAPES) ×2 IMPLANT
DECANTER SPIKE VIAL GLASS SM (MISCELLANEOUS) ×2 IMPLANT
DERMABOND ADVANCED (GAUZE/BANDAGES/DRESSINGS) ×1
DERMABOND ADVANCED .7 DNX12 (GAUZE/BANDAGES/DRESSINGS) ×1 IMPLANT
DIFFUSER DRILL AIR PNEUMATIC (MISCELLANEOUS) ×2 IMPLANT
DRAPE C-ARM 42X72 X-RAY (DRAPES) ×4 IMPLANT
DRAPE MICROSCOPE LEICA (MISCELLANEOUS) IMPLANT
DRAPE POUCH INSTRU U-SHP 10X18 (DRAPES) ×2 IMPLANT
DRSG ADAPTIC 3X8 NADH LF (GAUZE/BANDAGES/DRESSINGS) ×2 IMPLANT
DRSG OPSITE POSTOP 4X8 (GAUZE/BANDAGES/DRESSINGS) ×2 IMPLANT
DRSG PAD ABDOMINAL 8X10 ST (GAUZE/BANDAGES/DRESSINGS) IMPLANT
ELECT REM PT RETURN 9FT ADLT (ELECTROSURGICAL) ×2
ELECTRODE REM PT RTRN 9FT ADLT (ELECTROSURGICAL) ×1 IMPLANT
GAUZE SPONGE 4X4 12PLY STRL (GAUZE/BANDAGES/DRESSINGS) ×2 IMPLANT
GAUZE SPONGE 4X4 16PLY XRAY LF (GAUZE/BANDAGES/DRESSINGS) ×2 IMPLANT
GLOVE BIO SURGEON STRL SZ7 (GLOVE) ×10 IMPLANT
GLOVE BIO SURGEON STRL SZ7.5 (GLOVE) ×2 IMPLANT
GLOVE BIOGEL PI IND STRL 7.0 (GLOVE) IMPLANT
GLOVE BIOGEL PI IND STRL 7.5 (GLOVE) ×5 IMPLANT
GLOVE BIOGEL PI INDICATOR 7.0 (GLOVE)
GLOVE BIOGEL PI INDICATOR 7.5 (GLOVE) ×5
GLOVE SS BIOGEL STRL SZ 7.5 (GLOVE) ×2 IMPLANT
GLOVE SUPERSENSE BIOGEL SZ 7.5 (GLOVE) ×2
GOWN STRL REUS W/ TWL LRG LVL3 (GOWN DISPOSABLE) ×6 IMPLANT
GOWN STRL REUS W/ TWL XL LVL3 (GOWN DISPOSABLE) IMPLANT
GOWN STRL REUS W/TWL 2XL LVL3 (GOWN DISPOSABLE) IMPLANT
GOWN STRL REUS W/TWL LRG LVL3 (GOWN DISPOSABLE) ×12
GOWN STRL REUS W/TWL XL LVL3 (GOWN DISPOSABLE)
HEMOSTAT POWDER KIT SURGIFOAM (HEMOSTASIS) ×2 IMPLANT
HEMOSTAT SURGICEL 2X14 (HEMOSTASIS) IMPLANT
KIT BASIN OR (CUSTOM PROCEDURE TRAY) ×2 IMPLANT
KIT ROOM TURNOVER OR (KITS) ×2 IMPLANT
MARKER SKIN DUAL TIP RULER LAB (MISCELLANEOUS) ×2 IMPLANT
NEEDLE 18GX1X1/2 (RX/OR ONLY) (NEEDLE) ×2 IMPLANT
NEEDLE HYPO 18GX1.5 BLUNT FILL (NEEDLE) IMPLANT
NEEDLE HYPO 21X1.5 SAFETY (NEEDLE) ×4 IMPLANT
NEEDLE SPNL 22GX3.5 QUINCKE BK (NEEDLE) ×2 IMPLANT
NS IRRIG 1000ML POUR BTL (IV SOLUTION) ×2 IMPLANT
OIL CARTRIDGE MAESTRO DRILL (MISCELLANEOUS) ×2
PACK LAMINECTOMY NEURO (CUSTOM PROCEDURE TRAY) ×2 IMPLANT
PACK UNIVERSAL I (CUSTOM PROCEDURE TRAY) ×2 IMPLANT
PATTIES SURGICAL .5X1.5 (GAUZE/BANDAGES/DRESSINGS) IMPLANT
PIN MAYFIELD SKULL DISP (PIN) ×2 IMPLANT
ROD VIRAGE 3.5X50MM (Rod) ×2 IMPLANT
ROD VIRAGE 3.5X60 (Rod) ×2 IMPLANT
RUBBERBAND STERILE (MISCELLANEOUS) IMPLANT
SCREW VIRAGE 3.5X20 (Screw) ×4 IMPLANT
SCREW VIRAGE 4.0X14MM (Screw) ×8 IMPLANT
SEALER BIPOLAR AQUA 2.3 (INSTRUMENTS) ×2 IMPLANT
SPONGE INTESTINAL PEANUT (DISPOSABLE) IMPLANT
SPONGE NEURO XRAY DETECT 1X3 (DISPOSABLE) IMPLANT
SPONGE SURGIFOAM ABS GEL SZ50 (HEMOSTASIS) ×2 IMPLANT
STAPLER VISISTAT 35W (STAPLE) ×2 IMPLANT
STRIP SURGICAL 1 X 6 IN (GAUZE/BANDAGES/DRESSINGS) IMPLANT
STRIP SURGICAL 1/2 X 6 IN (GAUZE/BANDAGES/DRESSINGS) IMPLANT
STRIP SURGICAL 1/4 X 6 IN (GAUZE/BANDAGES/DRESSINGS) IMPLANT
STRIP SURGICAL 3/4 X 6 IN (GAUZE/BANDAGES/DRESSINGS) IMPLANT
SUT STRATAFIX 1PDS 45CM VIOLET (SUTURE) IMPLANT
SUT STRATAFIX MNCRL+ 3-0 PS-2 (SUTURE)
SUT STRATAFIX MONOCRYL 3-0 (SUTURE)
SUT STRATAFIX SPIRAL + 2-0 (SUTURE) IMPLANT
SUT VIC AB 0 CT1 18XCR BRD8 (SUTURE) ×1 IMPLANT
SUT VIC AB 0 CT1 8-18 (SUTURE) ×2
SUT VIC AB 2-0 CT1 18 (SUTURE) ×2 IMPLANT
SUT VIC AB 3-0 SH 8-18 (SUTURE) IMPLANT
SUTURE STRATFX MNCRL+ 3-0 PS-2 (SUTURE) IMPLANT
SYR 30ML LL (SYRINGE) ×4 IMPLANT
SYR 3ML LL SCALE MARK (SYRINGE) IMPLANT
TOWEL GREEN STERILE (TOWEL DISPOSABLE) ×2 IMPLANT
TOWEL GREEN STERILE FF (TOWEL DISPOSABLE) ×2 IMPLANT
TRAY FOLEY W/METER SILVER 16FR (SET/KITS/TRAYS/PACK) IMPLANT
UNDERPAD 30X30 (UNDERPADS AND DIAPERS) ×2 IMPLANT
WATER STERILE IRR 1000ML POUR (IV SOLUTION) ×2 IMPLANT

## 2017-04-06 NOTE — Anesthesia Preprocedure Evaluation (Addendum)
Anesthesia Evaluation  Patient identified by MRN, date of birth, ID bandGeneral Assessment Comment:Sedated on vent  Reviewed: Allergy & Precautions, H&P , NPO status , Patient's Chart, lab work & pertinent test results  Airway Mallampati: Intubated   Neck ROM: Limited    Dental no notable dental hx. (+) Teeth Intact, Dental Advisory Given   Pulmonary  Intubated on vent   Pulmonary exam normal breath sounds clear to auscultation       Cardiovascular negative cardio ROS   Rhythm:Regular Rate:Normal     Neuro/Psych Quadraplegia negative psych ROS   GI/Hepatic negative GI ROS, Neg liver ROS,   Endo/Other  negative endocrine ROS  Renal/GU negative Renal ROS  negative genitourinary   Musculoskeletal   Abdominal   Peds  Hematology negative hematology ROS (+)   Anesthesia Other Findings   Reproductive/Obstetrics negative OB ROS                            Anesthesia Physical Anesthesia Plan  ASA: IV  Anesthesia Plan: General   Post-op Pain Management:    Induction: Intravenous  Airway Management Planned:   Additional Equipment: Arterial line  Intra-op Plan:   Post-operative Plan: Post-operative intubation/ventilation  Informed Consent: I have reviewed the patients History and Physical, chart, labs and discussed the procedure including the risks, benefits and alternatives for the proposed anesthesia with the patient or authorized representative who has indicated his/her understanding and acceptance.   Dental advisory given  Plan Discussed with: CRNA  Anesthesia Plan Comments:         Anesthesia Quick Evaluation

## 2017-04-06 NOTE — ED Notes (Signed)
Head wound wrapped with moist gauze, L arm wrapped with ortho device. R hand wrapped with gauze.

## 2017-04-06 NOTE — ED Notes (Signed)
Ortho at bedside.

## 2017-04-06 NOTE — Progress Notes (Signed)
Patient pressure still low. Contacted Dr. Andrey CampanileWilson and received verbal orders.
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
## 2017-04-06 NOTE — ED Notes (Signed)
PT IS STILL TALKING AFTER MEDICINE. IVS ARE PATENT AND RUNNING FINE

## 2017-04-06 NOTE — ED Notes (Signed)
DUPLICATE CHARTING OF AIRWAY

## 2017-04-06 NOTE — Anesthesia Postprocedure Evaluation (Signed)
Anesthesia Post Note  Patient: Lesia HausenJada M Ahonen  Procedure(s) Performed: Procedure(s) (LRB): C2- C4 LAMINECTOMY FOR DECOMPRESSION, OPEN REDUCTION OF DISPLACED FRACTURE , C2- C5 POSTERIOR FIXATION FUSION (N/A)  Patient location during evaluation: SICU Anesthesia Type: General Level of consciousness: sedated Pain management: pain level controlled Vital Signs Assessment: post-procedure vital signs reviewed and stable Respiratory status: patient remains intubated per anesthesia plan Cardiovascular status: stable Anesthetic complications: no       Last Vitals:  Vitals:   04/06/17 2126 04/06/17 2130  BP:  (!) 86/57  Pulse: 69 70  Resp: 18 18  Temp: (!) 34.8 C (!) 34.8 C    Last Pain:  Vitals:   04/06/17 1559  TempSrc: Oral                 Isma Tietje,W. EDMOND
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
## 2017-04-06 NOTE — Progress Notes (Signed)
   04/06/17 1900  Clinical Encounter Type  Visited With Patient and family together;Health care provider  Visit Type Follow-up;Spiritual support  Consult/Referral To Chaplain  Spiritual Encounters  Spiritual Needs Prayer;Emotional  Stress Factors  Patient Stress Factors None identified  Family Stress Factors Lack of knowledge;Loss of control;Major life changes    Chaplain met with family pre-surgery. Patient is still in surgery and family is in surgery waiting room. Grandmother of patient has been taken to ED after a fainting spell. Prayed with family and offered hospitality and ministry of presence. Reminded family of the importance of rest, hydration and self-care. Dresden Ament L. Volanda Napoleon, MDiv

## 2017-04-06 NOTE — ED Notes (Signed)
Pt returned from CT, no issues. Xray at bedside for portable arm.

## 2017-04-06 NOTE — Transfer of Care (Signed)
Immediate Anesthesia Transfer of Care Note  Patient: Kylie Osborn  Procedure(s) Performed: Procedure(s): C2- C4 LAMINECTOMY FOR DECOMPRESSION, OPEN REDUCTION OF DISPLACED FRACTURE , C2- C5 POSTERIOR FIXATION FUSION (N/A)  Patient Location: ICU  Anesthesia Type:General  Level of Consciousness: Patient remains intubated per anesthesia plan  Airway & Oxygen Therapy: Patient remains intubated per anesthesia plan and Patient placed on Ventilator (see vital sign flow sheet for setting)  Post-op Assessment: Report given to RN and Post -op Vital signs reviewed and stable  Post vital signs: Reviewed and stable  Last Vitals:  Vitals:   04/06/17 1615 04/06/17 1630  BP: (!) 77/41 (!) 83/46  Pulse: (!) 59 (!) 47  Resp: 18 18  Temp:      Last Pain:  Vitals:   04/06/17 1559  TempSrc: Oral         Complications: No apparent anesthesia complications

## 2017-04-06 NOTE — Progress Notes (Signed)
Orthopedic Tech Progress Note Patient Details:  Kylie HausenJada M Osborn 09/20/1996 [id number]  [name], [name] 04/06/2017, 11:20 AM As ordered by Dr. Eulah PontMurphy

## 2017-04-06 NOTE — Progress Notes (Signed)
Patient off floor to OR

## 2017-04-06 NOTE — ED Notes (Signed)
Kylie Osborn, Trauma PA paged Dr. Bevely Palmeritty Neurosurgery.

## 2017-04-06 NOTE — Progress Notes (Signed)
Paged on call trauma PA. Patients blood pressure is currently 73/41 (53) after fentanyl administration. Waiting on call back.
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
## 2017-04-06 NOTE — ED Notes (Signed)
Pt opening her eyes, following commands. Pt able to wiggle her L foot and L fingers

## 2017-04-06 NOTE — ED Notes (Signed)
Xray shooting portable xrays

## 2017-04-06 NOTE — ED Notes (Signed)
Pt transported to CT with this nurse and elizabeth NT

## 2017-04-06 NOTE — Brief Op Note (Signed)
04/06/2017  8:43 PM  PATIENT:  Kylie HausenJada M Osborn  21 y.o. female  PRE-OPERATIVE DIAGNOSIS:  cervical spine fracture  POST-OPERATIVE DIAGNOSIS:  cervical spine fracture  PROCEDURE:  Procedure(s): C2- C4 LAMINECTOMY FOR DECOMPRESSION, OPEN REDUCTION OF DISPLACED FRACTURE , C2- C5 POSTERIOR FIXATION FUSION (N/A)  SURGEON:  Surgeon(s) and Role:    * [name], Loura HaltBenjamin Jared, MD - Primary  PHYSICIAN ASSISTANT: Cindra PresumeVincent Costella, PA-C   ANESTHESIA:   general  EBL:  Total I/O In: 1670 [I.V.:1000; Blood:670] Out: 400 [Urine:200; Blood:200]  BLOOD ADMINISTERED:1 CC PRBC  DRAINS: none   LOCAL MEDICATIONS USED:  BUPIVICAINE  and LIDOCAINE   SPECIMEN:  No Specimen  DISPOSITION OF SPECIMEN:  N/A  COUNTS:  YES  TOURNIQUET:  * No tourniquets in log *  DICTATION: .Note written in EPIC  PLAN OF CARE: Admit to inpatient   PATIENT DISPOSITION:  PACU - hemodynamically stable.   Delay start of Pharmacological VTE agent (>24hrs) due to surgical blood loss or risk of bleeding: yes

## 2017-04-06 NOTE — ED Notes (Signed)
Aspen collar placed on patient
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
## 2017-04-06 NOTE — Progress Notes (Signed)
RT note- Patient returned back to NICU, fio2 decreased to 60%. Remains on current settings.

## 2017-04-06 NOTE — ED Notes (Signed)
UNABLE TO GET ANY MANUAL BP. PT ARM IS DEFORMED, PT R ARM ARE USED FOR IVS

## 2017-04-06 NOTE — ED Notes (Signed)
Unable to hear manual BP

## 2017-04-06 NOTE — ED Notes (Signed)
Neurosurgery BallingerVinny PA at bedside.
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
## 2017-04-06 NOTE — ED Notes (Signed)
Vital signs stable. 

## 2017-04-07 ENCOUNTER — Inpatient Hospital Stay (HOSPITAL_COMMUNITY): Payer: Commercial Managed Care - HMO

## 2017-04-07 ENCOUNTER — Ambulatory Visit: Payer: Self-pay | Admitting: Oral Surgery

## 2017-04-07 ENCOUNTER — Encounter (HOSPITAL_COMMUNITY): Payer: Self-pay | Admitting: Neurological Surgery

## 2017-04-07 LAB — BLOOD GAS, ARTERIAL
Acid-base deficit: 6 mmol/L — ABNORMAL HIGH (ref 0.0–2.0)
BICARBONATE: 18.2 mmol/L — AB (ref 20.0–28.0)
Drawn by: 129711
FIO2: 40
LHR: 18 {breaths}/min
O2 SAT: 99.4 %
PATIENT TEMPERATURE: 98.6
PEEP/CPAP: 5 cmH2O
PO2 ART: 193 mmHg — AB (ref 83.0–108.0)
VT: 500 mL
pCO2 arterial: 31.8 mmHg — ABNORMAL LOW (ref 32.0–48.0)
pH, Arterial: 7.377 (ref 7.350–7.450)

## 2017-04-07 LAB — COMPREHENSIVE METABOLIC PANEL
ALBUMIN: 2.5 g/dL — AB (ref 3.5–5.0)
ALK PHOS: 39 U/L (ref 38–126)
ALT: 34 U/L (ref 14–54)
AST: 83 U/L — ABNORMAL HIGH (ref 15–41)
Anion gap: 8 (ref 5–15)
BUN: <5 mg/dL — ABNORMAL LOW (ref 6–20)
CO2: 19 mmol/L — AB (ref 22–32)
Calcium: 7.2 mg/dL — ABNORMAL LOW (ref 8.9–10.3)
Chloride: 110 mmol/L (ref 101–111)
Creatinine, Ser: 0.67 mg/dL (ref 0.44–1.00)
GFR calc Af Amer: >60 mL/min (ref >60)
GFR calc non Af Amer: >60 mL/min (ref >60)
GLUCOSE: 127 mg/dL — AB (ref 65–99)
Potassium: 3.5 mmol/L (ref 3.5–5.1)
SODIUM: 137 mmol/L (ref 135–145)
Total Bilirubin: 0.6 mg/dL (ref 0.3–1.2)
Total Protein: 4.7 g/dL — ABNORMAL LOW (ref 6.5–8.1)

## 2017-04-07 LAB — POCT I-STAT 3, ART BLOOD GAS (G3+)
Acid-base deficit: 6 mmol/L — ABNORMAL HIGH (ref 0.0–2.0)
BICARBONATE: 18.6 mmol/L — AB (ref 20.0–28.0)
O2 Saturation: 100 %
PCO2 ART: 33.6 mmHg (ref 32.0–48.0)
PO2 ART: 198 mmHg — AB (ref 83.0–108.0)
Patient temperature: 38.5
TCO2: 20 mmol/L (ref 0–100)
pH, Arterial: 7.358 (ref 7.350–7.450)

## 2017-04-07 LAB — BLOOD PRODUCT ORDER (VERBAL) VERIFICATION

## 2017-04-07 LAB — CBC
HEMATOCRIT: 31.3 % — AB (ref 36.0–46.0)
Hemoglobin: 10.8 g/dL — ABNORMAL LOW (ref 12.0–15.0)
MCH: 32.2 pg (ref 26.0–34.0)
MCHC: 34.5 g/dL (ref 30.0–36.0)
MCV: 93.4 fL (ref 78.0–100.0)
Platelets: 179 10*3/uL (ref 150–400)
RBC: 3.35 MIL/uL — AB (ref 3.87–5.11)
RDW: 16.4 % — AB (ref 11.5–15.5)
WBC: 13.9 10*3/uL — AB (ref 4.0–10.5)

## 2017-04-07 MED ORDER — DEXAMETHASONE SODIUM PHOSPHATE 10 MG/ML IJ SOLN
INTRAMUSCULAR | Status: AC
  Filled 2017-04-07: qty 1

## 2017-04-07 MED ORDER — DEXAMETHASONE SODIUM PHOSPHATE 10 MG/ML IJ SOLN
10.0000 mg | Freq: Four times a day (QID) | INTRAMUSCULAR | Status: AC
  Administered 2017-04-07 – 2017-04-08 (×8): 10 mg via INTRAVENOUS
  Filled 2017-04-07 (×7): qty 1
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
## 2017-04-07 NOTE — Plan of Care (Signed)
Problem: Tissue Perfusion: Goal: Risk factors for ineffective tissue perfusion will decrease Outcome: Progressing SCD's in place and explanation given to family. Family acknowledges understanding.
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
## 2017-04-07 NOTE — Progress Notes (Signed)
Orthopedic Tech Progress Note Patient Details:  Lesia HausenJada M Belsito 06/21/1996 [id number]  [name], Sugartong splint Ortho Device/Splint Location: lue Ortho Device/Splint Interventions: Application   Saul FordyceJennifer C Georgios Kina 04/07/2017, 10:08 AM

## 2017-04-07 NOTE — Progress Notes (Signed)
Permission given by mother to cut weave/dreds from patients hair. The extensions have  metal bands and trinkets woven in and can not be removed. Patient unable to proceed with MRI if not removed.

## 2017-04-07 NOTE — Progress Notes (Signed)
Patient sedation returned to previous setting post reduction.

## 2017-04-07 NOTE — Progress Notes (Signed)
Patient sedation turned up and fentanyl given for hand reduction at bedside.

## 2017-04-07 NOTE — Progress Notes (Signed)
Stopped by and visited w/ pt's 3 family members in rm, including grajndmother and both grandfathers. Provided emotional/spiritual support and prayer -- which they all appreciated. Nurse says pt has at times had feeling in extremities and seemed to come thru surgery ok, so family is taking some hope, especially as young as she is, for recovery.   Yesterday had made initial notification calls to both pt's mom and sister to ask [place] Mom has briefly left now to freshen up, will be back later.Chaplain available for f/u.   04/07/17 1355  Clinical Encounter Type  Visited With Patient and family together;Health care provider  Visit Type Follow-up;Psychological support;Spiritual support;Social support;Critical Care  Referral From Chaplain  Spiritual Encounters  Spiritual Needs Prayer;Emotional  Stress Factors  Patient Stress Factors Health changes;Loss of control  Family Stress Factors Family relationships;Health changes;Loss of control  Advance Directives (For Healthcare)  Does Patient Have a Medical Advance Directive? No  Would patient like information on creating a medical advance directive? No - Patient declined  Mental Health Advance Directives  Does Patient Have a Mental Health Advance Directive? No  Would patient like information on creating a mental health advance directive? No - Patient declined   .Ephraim Hamburgerynthia A Janyah Singleterry, Chaplain

## 2017-04-07 NOTE — Progress Notes (Signed)
Dr. Danielle DessElsner notified that patient was not able to perform same movements as charted pre-surgery. Orders received for 10mg  decadron q6h and a stat cervical spine CT. Performed and discussed results/plan of care with Dr. Danielle DessElsner. Will closely monitor.

## 2017-04-08 ENCOUNTER — Inpatient Hospital Stay (HOSPITAL_COMMUNITY): Payer: Commercial Managed Care - HMO | Admitting: Anesthesiology

## 2017-04-08 ENCOUNTER — Encounter (HOSPITAL_COMMUNITY): Payer: Self-pay | Admitting: Anesthesiology

## 2017-04-08 ENCOUNTER — Encounter (HOSPITAL_COMMUNITY): Admission: EM | Disposition: A | Discharge status: Rehabilitation Facility | Payer: Self-pay | Source: Home / Self Care

## 2017-04-08 DIAGNOSIS — S0102XA Laceration with foreign body of scalp, initial encounter: Secondary | ICD-10-CM | POA: Diagnosis not present

## 2017-04-08 HISTORY — PX: ORIF ULNAR FRACTURE: SHX5417

## 2017-04-08 HISTORY — PX: LACERATION REPAIR: SHX5284

## 2017-04-08 SURGERY — OPEN REDUCTION INTERNAL FIXATION (ORIF) ULNAR FRACTURE
Anesthesia: General | Site: Arm Lower

## 2017-04-08 MED ORDER — FENTANYL CITRATE (PF) 100 MCG/2ML IJ SOLN
INTRAMUSCULAR | Status: DC | PRN
  Administered 2017-04-08 (×2): 50 ug via INTRAVENOUS

## 2017-04-08 MED ORDER — BUPIVACAINE HCL (PF) 0.25 % IJ SOLN
INTRAMUSCULAR | Status: AC
  Filled 2017-04-08: qty 30

## 2017-04-08 MED ORDER — ONDANSETRON HCL 4 MG/2ML IJ SOLN
INTRAMUSCULAR | Status: DC | PRN
  Administered 2017-04-08: 4 mg via INTRAVENOUS

## 2017-04-08 MED ORDER — PROPOFOL 10 MG/ML IV BOLUS
INTRAVENOUS | Status: DC | PRN
  Administered 2017-04-08: 20 mg via INTRAVENOUS

## 2017-04-08 MED ORDER — CEFAZOLIN SODIUM 1 G IJ SOLR
INTRAMUSCULAR | Status: AC
  Filled 2017-04-08: qty 20

## 2017-04-08 MED ORDER — ONDANSETRON HCL 4 MG/2ML IJ SOLN
INTRAMUSCULAR | Status: AC
  Filled 2017-04-08: qty 2

## 2017-04-08 MED ORDER — LACTATED RINGERS IV SOLN
INTRAVENOUS | Status: DC | PRN
  Administered 2017-04-08: 14:00:00 via INTRAVENOUS

## 2017-04-08 MED ORDER — SODIUM CHLORIDE 0.9 % IR SOLN
Status: DC | PRN
  Administered 2017-04-08: 3000 mL

## 2017-04-08 MED ORDER — SODIUM CHLORIDE 0.9 % IV SOLN
1.0000 mg/h | INTRAVENOUS | Status: DC
  Administered 2017-04-08: 2 mg/h via INTRAVENOUS
  Administered 2017-04-09: 5 mg/h via INTRAVENOUS
  Filled 2017-04-08 (×2): qty 10

## 2017-04-08 MED ORDER — PROPOFOL 500 MG/50ML IV EMUL
INTRAVENOUS | Status: DC | PRN
  Administered 2017-04-08: 30 ug/kg/min via INTRAVENOUS

## 2017-04-08 MED ORDER — 0.9 % SODIUM CHLORIDE (POUR BTL) OPTIME
TOPICAL | Status: DC | PRN
  Administered 2017-04-08 (×2): 1000 mL

## 2017-04-08 MED ORDER — LIDOCAINE 2% (20 MG/ML) 5 ML SYRINGE
INTRAMUSCULAR | Status: AC
  Filled 2017-04-08: qty 5

## 2017-04-08 MED ORDER — ROCURONIUM BROMIDE 10 MG/ML (PF) SYRINGE
PREFILLED_SYRINGE | INTRAVENOUS | Status: AC
  Filled 2017-04-08: qty 5

## 2017-04-08 MED ORDER — LIDOCAINE-EPINEPHRINE (PF) 1 %-1:200000 IJ SOLN
INTRAMUSCULAR | Status: AC
  Filled 2017-04-08: qty 30

## 2017-04-08 MED ORDER — MIDAZOLAM HCL 2 MG/2ML IJ SOLN
INTRAMUSCULAR | Status: AC
  Filled 2017-04-08: qty 2

## 2017-04-08 MED ORDER — ROCURONIUM BROMIDE 100 MG/10ML IV SOLN
INTRAVENOUS | Status: DC | PRN
  Administered 2017-04-08: 30 mg via INTRAVENOUS

## 2017-04-08 MED ORDER — BACITRACIN ZINC 500 UNIT/GM EX OINT
TOPICAL_OINTMENT | CUTANEOUS | Status: AC
  Filled 2017-04-08: qty 28.35

## 2017-04-08 MED ORDER — BACITRACIN ZINC 500 UNIT/GM EX OINT
TOPICAL_OINTMENT | CUTANEOUS | Status: DC | PRN
  Administered 2017-04-08: 1 via TOPICAL

## 2017-04-08 MED ORDER — LIDOCAINE-EPINEPHRINE 1 %-1:100000 IJ SOLN
INTRAMUSCULAR | Status: DC | PRN
  Administered 2017-04-08: 10 mL via INTRADERMAL

## 2017-04-08 MED ORDER — CEFAZOLIN SODIUM-DEXTROSE 2-3 GM-% IV SOLR
2.0000 g | Freq: Once | INTRAVENOUS | Status: AC
  Administered 2017-04-08: 2 g via INTRAVENOUS

## 2017-04-08 MED ORDER — MIDAZOLAM HCL 5 MG/5ML IJ SOLN
INTRAMUSCULAR | Status: DC | PRN
  Administered 2017-04-08: 2 mg via INTRAVENOUS

## 2017-04-08 MED ORDER — FENTANYL CITRATE (PF) 250 MCG/5ML IJ SOLN
INTRAMUSCULAR | Status: AC
  Filled 2017-04-08: qty 5

## 2017-04-08 SURGICAL SUPPLY — 109 items
APL SKNCLS STERI-STRIP NONHPOA (GAUZE/BANDAGES/DRESSINGS)
BANDAGE ACE 3X5.8 VEL STRL LF (GAUZE/BANDAGES/DRESSINGS) ×3 IMPLANT
BANDAGE ACE 4X5 VEL STRL LF (GAUZE/BANDAGES/DRESSINGS) ×3 IMPLANT
BANDAGE ELASTIC 3 VELCRO ST LF (GAUZE/BANDAGES/DRESSINGS) ×3 IMPLANT
BANDAGE ELASTIC 6 VELCRO ST LF (GAUZE/BANDAGES/DRESSINGS) ×3 IMPLANT
BENZOIN TINCTURE PRP APPL 2/3 (GAUZE/BANDAGES/DRESSINGS) IMPLANT
BIT DRILL 2.6 (BIT) ×3 IMPLANT
BLADE CLIPPER SURG (BLADE) ×3 IMPLANT
BLADE SURG 10 STRL SS (BLADE) IMPLANT
BLADE SURG 15 STRL LF DISP TIS (BLADE) IMPLANT
BLADE SURG 15 STRL SS (BLADE)
BNDG CMPR 9X4 STRL LF SNTH (GAUZE/BANDAGES/DRESSINGS)
BNDG COHESIVE 4X5 TAN STRL (GAUZE/BANDAGES/DRESSINGS) ×3 IMPLANT
BNDG CONFORM 2 STRL LF (GAUZE/BANDAGES/DRESSINGS) ×3 IMPLANT
BNDG ESMARK 4X9 LF (GAUZE/BANDAGES/DRESSINGS) IMPLANT
BNDG GAUZE ELAST 4 BULKY (GAUZE/BANDAGES/DRESSINGS) ×9 IMPLANT
CANISTER SUCT 3000ML PPV (MISCELLANEOUS) ×3 IMPLANT
CLEANER TIP ELECTROSURG 2X2 (MISCELLANEOUS) ×3 IMPLANT
CORDS BIPOLAR (ELECTRODE) IMPLANT
COUNTER NEEDLE 20 DBL MAG RED (NEEDLE) ×3 IMPLANT
COVER SURGICAL LIGHT HANDLE (MISCELLANEOUS) ×3 IMPLANT
CUFF TOURNIQUET SINGLE 18IN (TOURNIQUET CUFF) ×6 IMPLANT
CUFF TOURNIQUET SINGLE 24IN (TOURNIQUET CUFF) ×3 IMPLANT
DECANTER SPIKE VIAL GLASS SM (MISCELLANEOUS) ×3 IMPLANT
DRAIN PENROSE 1/4X12 LTX STRL (WOUND CARE) ×3 IMPLANT
DRAPE C-ARM 42X72 X-RAY (DRAPES) ×3 IMPLANT
DRAPE HALF SHEET 40X57 (DRAPES) ×3 IMPLANT
DRAPE IMP U-DRAPE 54X76 (DRAPES) ×3 IMPLANT
DRAPE INCISE IOBAN 66X45 STRL (DRAPES) IMPLANT
DRAPE ORTHO SPLIT 77X108 STRL (DRAPES) ×6
DRAPE SURG ORHT 6 SPLT 77X108 (DRAPES) ×4 IMPLANT
DRAPE U-SHAPE 47X51 STRL (DRAPES) ×3 IMPLANT
DRESSING TELFA 8X10 (GAUZE/BANDAGES/DRESSINGS) ×3 IMPLANT
DRSG ADAPTIC 3X8 NADH LF (GAUZE/BANDAGES/DRESSINGS) ×6 IMPLANT
DRSG PAD ABDOMINAL 8X10 ST (GAUZE/BANDAGES/DRESSINGS) ×3 IMPLANT
DURAPREP 26ML APPLICATOR (WOUND CARE) ×3 IMPLANT
ELECT CAUTERY BLADE 6.4 (BLADE) ×3 IMPLANT
ELECT COATED BLADE 2.86 ST (ELECTRODE) ×3 IMPLANT
ELECT REM PT RETURN 9FT ADLT (ELECTROSURGICAL) ×6
ELECTRODE REM PT RTRN 9FT ADLT (ELECTROSURGICAL) ×4 IMPLANT
EVACUATOR SILICONE 100CC (DRAIN) IMPLANT
GAUZE SPONGE 4X4 12PLY STRL (GAUZE/BANDAGES/DRESSINGS) ×3 IMPLANT
GAUZE SPONGE 4X4 12PLY STRL LF (GAUZE/BANDAGES/DRESSINGS) ×3 IMPLANT
GAUZE SPONGE 4X4 16PLY XRAY LF (GAUZE/BANDAGES/DRESSINGS) ×3 IMPLANT
GAUZE XEROFORM 5X9 LF (GAUZE/BANDAGES/DRESSINGS) ×3 IMPLANT
GLOVE BIO SURGEON STRL SZ7.5 (GLOVE) ×12 IMPLANT
GLOVE BIOGEL M 7.0 STRL (GLOVE) ×6 IMPLANT
GLOVE BIOGEL PI IND STRL 8 (GLOVE) ×4 IMPLANT
GLOVE BIOGEL PI INDICATOR 8 (GLOVE) ×2
GLOVE ORTHO TXT STRL SZ7.5 (GLOVE) ×3 IMPLANT
GLOVE SURG SS PI 8.0 STRL IVOR (GLOVE) ×3 IMPLANT
GOWN STRL REUS W/ TWL LRG LVL3 (GOWN DISPOSABLE) ×8 IMPLANT
GOWN STRL REUS W/ TWL XL LVL3 (GOWN DISPOSABLE) ×2 IMPLANT
GOWN STRL REUS W/TWL LRG LVL3 (GOWN DISPOSABLE) ×12
GOWN STRL REUS W/TWL XL LVL3 (GOWN DISPOSABLE) ×3
KIT BASIN OR (CUSTOM PROCEDURE TRAY) ×6 IMPLANT
KIT ROOM TURNOVER OR (KITS) ×6 IMPLANT
LOCATOR NERVE 3 VOLT (DISPOSABLE) IMPLANT
MANIFOLD NEPTUNE II (INSTRUMENTS) IMPLANT
NEEDLE 18GX1X1/2 (RX/OR ONLY) (NEEDLE) ×6 IMPLANT
NEEDLE 22X1 1/2 (OR ONLY) (NEEDLE) ×3 IMPLANT
NEEDLE HYPO 25GX1X1/2 BEV (NEEDLE) IMPLANT
NS IRRIG 1000ML POUR BTL (IV SOLUTION) ×6 IMPLANT
PACK ORTHO EXTREMITY (CUSTOM PROCEDURE TRAY) ×3 IMPLANT
PACK UNIVERSAL I (CUSTOM PROCEDURE TRAY) ×3 IMPLANT
PAD ARMBOARD 7.5X6 YLW CONV (MISCELLANEOUS) ×9 IMPLANT
PAD CAST 4YDX4 CTTN HI CHSV (CAST SUPPLIES) ×2 IMPLANT
PADDING CAST COTTON 4X4 STRL (CAST SUPPLIES) ×3
PENCIL BUTTON HOLSTER BLD 10FT (ELECTRODE) ×3 IMPLANT
PLATE COMP NARROW STRT 6H 78MM (Plate) ×3 IMPLANT
PLATE COMPRESSION 6H L79MM (Plate) ×3 IMPLANT
SCREW BONE 3.5X12 (Screw) ×21 IMPLANT
SCREW BONE 3.5X14MM (Screw) ×15 IMPLANT
SET CYSTO W/LG BORE CLAMP LF (SET/KITS/TRAYS/PACK) ×3 IMPLANT
SOAP 2 % CHG 4 OZ (WOUND CARE) ×6 IMPLANT
SOL PREP POV-IOD 4OZ 10% (MISCELLANEOUS) ×6 IMPLANT
SPLINT FIBERGLASS 3X35 (CAST SUPPLIES) ×3 IMPLANT
SPONGE LAP 18X18 X RAY DECT (DISPOSABLE) ×3 IMPLANT
STAPLER SKIN 35 WIDE (STAPLE) ×3 IMPLANT
STAPLER VISISTAT 35W (STAPLE) ×3 IMPLANT
STRIP CLOSURE SKIN 1/2X4 (GAUZE/BANDAGES/DRESSINGS) IMPLANT
SUCTION FRAZIER HANDLE 10FR (MISCELLANEOUS) ×1
SUCTION TUBE FRAZIER 10FR DISP (MISCELLANEOUS) ×2 IMPLANT
SUT ETHILON 3 0 PS 1 (SUTURE) ×9 IMPLANT
SUT ETHILON 5 0 PS 2 18 (SUTURE) ×3 IMPLANT
SUT FIBERWIRE #2 38 REV NDL BL (SUTURE)
SUT MNCRL AB 4-0 PS2 18 (SUTURE) ×3 IMPLANT
SUT MON AB 2-0 CT1 36 (SUTURE) ×3 IMPLANT
SUT SILK 2 0 FS (SUTURE) IMPLANT
SUT SILK 3 0 REEL (SUTURE) IMPLANT
SUT VIC AB 0 CT1 27 (SUTURE) ×3
SUT VIC AB 0 CT1 27XBRD ANBCTR (SUTURE) ×2 IMPLANT
SUT VIC AB 3-0 FS2 27 (SUTURE) ×6 IMPLANT
SUT VIC AB 3-0 PS2 18 (SUTURE)
SUT VIC AB 3-0 PS2 18XBRD (SUTURE) IMPLANT
SUT VIC AB 3-0 SH 27 (SUTURE) ×12
SUT VIC AB 3-0 SH 27X BRD (SUTURE) ×8 IMPLANT
SUT VIC AB 4-0 P-3 18X BRD (SUTURE) IMPLANT
SUT VIC AB 4-0 P3 18 (SUTURE)
SUTURE FIBERWR#2 38 REV NDL BL (SUTURE) IMPLANT
SYR 50ML SLIP (SYRINGE) ×3 IMPLANT
SYR CONTROL 10ML LL (SYRINGE) ×3 IMPLANT
TOWEL OR 17X24 6PK STRL BLUE (TOWEL DISPOSABLE) ×6 IMPLANT
TOWEL OR 17X26 10 PK STRL BLUE (TOWEL DISPOSABLE) ×3 IMPLANT
TRAY ENT MC OR (CUSTOM PROCEDURE TRAY) ×3 IMPLANT
TUBE CONNECTING 12X1/4 (SUCTIONS) ×6 IMPLANT
UNDERPAD 30X30 (UNDERPADS AND DIAPERS) ×3 IMPLANT
WATER STERILE IRR 1000ML POUR (IV SOLUTION) ×6 IMPLANT
YANKAUER SUCT BULB TIP NO VENT (SUCTIONS) ×3 IMPLANT

## 2017-04-08 NOTE — Anesthesia Procedure Notes (Signed)
Date/Time: 04/08/2017 2:23 PM Performed by: Lovie CholOCK, Crisanto Nied K Pre-anesthesia Checklist: Patient identified, Emergency Drugs available, Suction available and Patient being monitored Patient Re-evaluated:Patient Re-evaluated prior to inductionOxygen Delivery Method: Circle system utilized Preoxygenation: Pre-oxygenation with 100% oxygen Intubation Type: Inhalational induction with existing ETT

## 2017-04-08 NOTE — Progress Notes (Signed)
Patient returned from OR

## 2017-04-08 NOTE — Procedures (Signed)
I performed a closed reduction of her Left CMC joints 2-5 as well as a closed reduction of her 5th MC fracture. She was splinted and tolerated this well

## 2017-04-08 NOTE — Plan of Care (Signed)
Problem: Skin Integrity: Goal: Risk for impaired skin integrity will decrease Outcome: Progressing Patient being turned Q2 hrs, frequent mouth care, and elevation off limbs to prevent pressure issues. Family understands importance of care.
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
## 2017-04-08 NOTE — Op Note (Signed)
04/06/2017 - 04/08/2017  4:11 PM  PATIENT:  Kylie Osborn    PRE-OPERATIVE DIAGNOSIS:  forearm fracture  POST-OPERATIVE DIAGNOSIS:  Same  PROCEDURE:  OPEN REDUCTION INTERNAL FIXATION (ORIF) ULNAR FRACTURE and RADIAL FRACTURE  SURGEON:  Alexxa Sabet, Jewel BaizeIMOTHY D, MD  ASSISTANT: Aquilla HackerHenry Martensen, PA-C, he was present and scrubbed throughout the case, critical for completion in a timely fashion, and for retraction, instrumentation, and closure.   ANESTHESIA:   gen  PREOPERATIVE INDICATIONS:  Kylie Osborn is a  21 y.o. female with a diagnosis of forearm fracture who failed conservative measures and elected for surgical management.    The risks benefits and alternatives were discussed with the patient preoperatively including but not limited to the risks of infection, bleeding, nerve injury, cardiopulmonary complications, the need for revision surgery, among others, and the patient was willing to proceed.  OPERATIVE IMPLANTS: stryker variax plate and k wires  OPERATIVE FINDINGS: unstable CMC joints  BLOOD LOSS: min  COMPLICATIONS: none  TOURNIQUET TIME: 90min  OPERATIVE PROCEDURE:  Patient was identified in the preoperative holding area and site was marked by me She was transported to the operating theater and placed on the table in supine position taking care to pad all bony prominences. After a preincinduction time out anesthesia was induced. The left upper extremity was prepped and draped in normal sterile fashion and a pre-incision timeout was performed. She received ancef for preoperative antibiotics.   Medical approach to her radius fracture. Identified are fracture ends protecting all neurovascular structures likely nasal soft tissue performed to reduction. Placed the K wire pinning this in place.   Placed a 6-hole periarticular plate in compression fracture fashion on the fracture. Purchase on all screws. Took x-rays and was happy with the reduction and alignment. I then thoroughly  irrigated this incision. Close the skin with nylon stitches  I then approached the ulna with a second incision identified this fracture site and cleaned of soft tissue reducing this one as well. Reduction was anatomic to place another compression plate was very happy with the alignment.  Next [facility] dislocations of her fifth metacarpal and dislocations at metacarpals to 3 and 4 appointment close reduction here confirmed alignment on x-ray and placed K wires across metacarpals to 4 Pitting Reasonable Pl. to stabilize the third metacarpal as well. I then pinned the fifth metacarpal across the fracture site to the second and started to the fourth and third.  With multiple lectures of her hand is happy with the alignment and stability here thoroughly irrigated her wounds performed a complex closure of her dorsal hand wound that was 5 cm in total.  After thoroughly irrigating closing over incisions sterile dressings were applied to place in a short arm volar slab splint she is awoken taken the PACU in stable condition please see alternate op note for scalp wound closure.   POST OPERATIVE PLAN: NWB LUE. DVT px. Per primary team

## 2017-04-08 NOTE — Clinical Social Work Note (Signed)
Pt intubated at this time. CSW will continue to follow .  AvonBridget Sequoia Mincey, [place]WA [phone]

## 2017-04-08 NOTE — Interval H&P Note (Signed)
History and Physical Interval Note:  04/08/2017 7:59 AM  Kylie Osborn  has presented today for surgery, with the diagnosis of forearm fracture  The various methods of treatment have been discussed with the patient and family. After consideration of risks, benefits and other options for treatment, the patient has consented to  Procedure(s): OPEN REDUCTION INTERNAL FIXATION (ORIF) ULNAR FRACTURE (Left) SCALP LACERATION REPAIR (N/A) as a surgical intervention .  The patient's history has been reviewed, patient examined, no change in status, stable for surgery.  I have reviewed the patient's chart and labs.  Questions were answered to the patient's satisfaction.     Alexxia Stankiewicz D

## 2017-04-08 NOTE — Anesthesia Postprocedure Evaluation (Signed)
Anesthesia Post Note  Patient: Kylie Osborn  Procedure(s) Performed: Procedure(s) (LRB): OPEN REDUCTION INTERNAL FIXATION (ORIF) ULNAR FRACTURE and RADIAL FRACTURE (Left) SCALP LACERATION REPAIR (N/A)  Patient location during evaluation: SICU Anesthesia Type: General Level of consciousness: sedated Pain management: pain level controlled Vital Signs Assessment: post-procedure vital signs reviewed and stable Respiratory status: patient remains intubated per anesthesia plan Cardiovascular status: stable Anesthetic complications: no       Last Vitals:  Vitals:   04/08/17 1900 04/08/17 2000  BP: 108/67 109/62  Pulse: (!) 48 (!) 46  Resp: 18 18  Temp: (!) 35.7 C 36.2 C    Last Pain:  Vitals:   04/08/17 1212  TempSrc:   PainSc: Asleep                 Kylie Osborn DAVID
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
## 2017-04-08 NOTE — Transfer of Care (Signed)
Immediate Anesthesia Transfer of Care Note  Patient: Kylie HausenJada M Osborn  Procedure(s) Performed: Procedure(s): OPEN REDUCTION INTERNAL FIXATION (ORIF) ULNAR FRACTURE and RADIAL FRACTURE (Left) SCALP LACERATION REPAIR (N/A)  Patient Location: NICU  Anesthesia Type:General  Level of Consciousness: sedated and Patient remains intubated per anesthesia plan  Airway & Oxygen Therapy: Patient remains intubated per anesthesia plan and Patient placed on Ventilator (see vital sign flow sheet for setting)  Post-op Assessment: Report given to RN and Post -op Vital signs reviewed and stable  Post vital signs: Reviewed and stable  Last Vitals:  Vitals:   04/08/17 1300 04/08/17 1400  BP: 109/69 112/69  Pulse: (!) 46 (!) 48  Resp: 18 18  Temp: 37.6 C 37.7 C    Last Pain:  Vitals:   04/08/17 1212  TempSrc:   PainSc: Asleep         Complications: No apparent anesthesia complications

## 2017-04-08 NOTE — Anesthesia Preprocedure Evaluation (Addendum)
Anesthesia Evaluation  Patient identified by MRN, date of birth, ID band  Reviewed: Allergy & Precautions, NPO status , Patient's Chart, lab work & pertinent test results  Airway Mallampati: Intubated   Neck ROM: Limited    Dental   Pulmonary neg pulmonary ROS,    Pulmonary exam normal breath sounds clear to auscultation       Cardiovascular negative cardio ROS Normal cardiovascular exam Rhythm:Regular Rate:Normal     Neuro/Psych quadreplegia negative psych ROS   GI/Hepatic negative GI ROS, Neg liver ROS,   Endo/Other  negative endocrine ROS  Renal/GU negative Renal ROS     Musculoskeletal   Abdominal   Peds  Hematology negative hematology ROS (+)   Anesthesia Other Findings   Reproductive/Obstetrics negative OB ROS                          Anesthesia Physical Anesthesia Plan  ASA: III  Anesthesia Plan: General   Post-op Pain Management:    Induction: Intravenous  Airway Management Planned: Oral ETT  Additional Equipment:   Intra-op Plan:   Post-operative Plan: Post-operative intubation/ventilation  Informed Consent: I have reviewed the patients History and Physical, chart, labs and discussed the procedure including the risks, benefits and alternatives for the proposed anesthesia with the patient or authorized representative who has indicated his/her understanding and acceptance.     Plan Discussed with: CRNA and Surgeon  Anesthesia Plan Comments:         Anesthesia Quick Evaluation

## 2017-04-08 NOTE — Brief Op Note (Signed)
04/06/2017 - 04/08/2017  8:28 PM  PATIENT:  Kylie Osborn  21 y.o. female  PRE-OPERATIVE DIAGNOSIS:  Large/complex parietal scalp laceration  POST-OPERATIVE DIAGNOSIS:  S/p repair  PROCEDURE:  Repair of complex scalp laceration ~20cm  SURGEON:  Miku Udall, Ranelle OysterJustin L, DMD     ASSISTANTS: Camila LiMelissa Brewer   ANESTHESIA:  General  EBL:  50cc  BLOOD ADMINISTERED:none  DRAINS: none   COMPLICATIONS: none  OPERATIVE FINDINGS: stellate laceration of the frontal/parietal and occipital scalp; gravel/glass debrided from wound; pressure dressing applied at end of case.  LOCAL MEDICATIONS USED:  LIDOCAINE   SPECIMEN:  No Specimen  DICTATION:  Dragon dictation   PATIENT DISPOSITION:  ICU

## 2017-04-08 NOTE — Progress Notes (Signed)
Patient off floor to OR
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
## 2017-04-09 ENCOUNTER — Inpatient Hospital Stay (HOSPITAL_COMMUNITY): Payer: Commercial Managed Care - HMO

## 2017-04-09 LAB — BLOOD GAS, ARTERIAL
Acid-base deficit: 1.4 mmol/L (ref 0.0–2.0)
Bicarbonate: 21.3 mmol/L (ref 20.0–28.0)
Drawn by: 41977
FIO2: 30
LHR: 18 {breaths}/min
O2 Saturation: 99.1 %
PATIENT TEMPERATURE: 98.6
PEEP: 5 cmH2O
PO2 ART: 150 mmHg — AB (ref 83.0–108.0)
VT: 500 mL
pCO2 arterial: 27 mmHg — ABNORMAL LOW (ref 32.0–48.0)
pH, Arterial: 7.508 — ABNORMAL HIGH (ref 7.350–7.450)

## 2017-04-09 LAB — POCT I-STAT 3, ART BLOOD GAS (G3+)
Acid-base deficit: 1 mmol/L (ref 0.0–2.0)
BICARBONATE: 24.6 mmol/L (ref 20.0–28.0)
Bicarbonate: 23.9 mmol/L (ref 20.0–28.0)
O2 SAT: 88 %
O2 Saturation: 85 %
PCO2 ART: 39.5 mmHg (ref 32.0–48.0)
PO2 ART: 51 mmHg — AB (ref 83.0–108.0)
TCO2: 26 mmol/L (ref 0–100)
TCO2: 25 mmol/L (ref 0–100)
pCO2 arterial: 40.3 mmHg (ref 32.0–48.0)
pH, Arterial: 7.395 (ref 7.350–7.450)
pH, Arterial: 7.39 (ref 7.350–7.450)
pO2, Arterial: 55 mmHg — ABNORMAL LOW (ref 83.0–108.0)

## 2017-04-09 LAB — BASIC METABOLIC PANEL
Anion gap: 9 (ref 5–15)
BUN: 11 mg/dL (ref 6–20)
CALCIUM: 7.9 mg/dL — AB (ref 8.9–10.3)
CO2: 22 mmol/L (ref 22–32)
CREATININE: 0.62 mg/dL (ref 0.44–1.00)
Chloride: 114 mmol/L — ABNORMAL HIGH (ref 101–111)
GFR calc Af Amer: >60 mL/min (ref >60)
Glucose, Bld: 126 mg/dL — ABNORMAL HIGH (ref 65–99)
Potassium: 3.3 mmol/L — ABNORMAL LOW (ref 3.5–5.1)
SODIUM: 145 mmol/L (ref 135–145)

## 2017-04-09 LAB — CBC
HCT: 24.2 % — ABNORMAL LOW (ref 36.0–46.0)
Hemoglobin: 8 g/dL — ABNORMAL LOW (ref 12.0–15.0)
MCH: 31.1 pg (ref 26.0–34.0)
MCHC: 33.1 g/dL (ref 30.0–36.0)
MCV: 94.2 fL (ref 78.0–100.0)
PLATELETS: 176 10*3/uL (ref 150–400)
RBC: 2.57 MIL/uL — ABNORMAL LOW (ref 3.87–5.11)
RDW: 15.4 % (ref 11.5–15.5)
WBC: 13.8 10*3/uL — ABNORMAL HIGH (ref 4.0–10.5)

## 2017-04-09 MED ORDER — CHLORHEXIDINE GLUCONATE 0.12% ORAL RINSE (MEDLINE KIT)
15.0000 mL | Freq: Two times a day (BID) | OROMUCOSAL | Status: DC
  Administered 2017-04-09 – 2017-04-18 (×18): 15 mL via OROMUCOSAL

## 2017-04-09 MED ORDER — CHLORHEXIDINE GLUCONATE 0.12 % MT SOLN
OROMUCOSAL | Status: AC
  Filled 2017-04-09: qty 15

## 2017-04-09 MED ORDER — ORAL CARE MOUTH RINSE
15.0000 mL | OROMUCOSAL | Status: DC
  Administered 2017-04-09 – 2017-04-16 (×69): 15 mL via OROMUCOSAL

## 2017-04-09 MED ORDER — FENTANYL CITRATE (PF) 100 MCG/2ML IJ SOLN
25.0000 ug | INTRAMUSCULAR | Status: DC | PRN
  Administered 2017-04-09 – 2017-04-10 (×9): 50 ug via INTRAVENOUS
  Filled 2017-04-09 (×8): qty 2

## 2017-04-09 MED ORDER — POTASSIUM CHLORIDE 10 MEQ/100ML IV SOLN
10.0000 meq | INTRAVENOUS | Status: AC
  Administered 2017-04-09 (×3): 10 meq via INTRAVENOUS
  Filled 2017-04-09 (×3): qty 100

## 2017-04-09 MED ORDER — FENTANYL 2500MCG IN NS 250ML (10MCG/ML) PREMIX INFUSION
0.0000 ug/h | INTRAVENOUS | Status: DC
  Administered 2017-04-09: 100 ug/h via INTRAVENOUS
  Filled 2017-04-09: qty 250

## 2017-04-09 MED ORDER — LORAZEPAM 2 MG/ML IJ SOLN
0.2500 mg | INTRAMUSCULAR | Status: DC | PRN
  Administered 2017-04-09 – 2017-04-10 (×2): 0.25 mg via INTRAVENOUS
  Filled 2017-04-09 (×2): qty 1

## 2017-04-09 MED ORDER — FUROSEMIDE 10 MG/ML IJ SOLN
20.0000 mg | Freq: Once | INTRAMUSCULAR | Status: AC
  Administered 2017-04-09: 20 mg via INTRAVENOUS
  Filled 2017-04-09: qty 2

## 2017-04-09 NOTE — Progress Notes (Signed)
1 Day Post-Op   Subjective/Chief Complaint: 21 y/o F POD1 s/p debridement and closure of left scalp laceration. Dressing was changed this morning my nursing staff. Patient still intubated.   Objective: Vital signs in last 24 hours: Temp:  [94.5 F (34.7 C)-100 F (37.8 C)] 99 F (37.2 C) (05/16 0700) Pulse Rate:  [39-66] 43 (05/16 0700) Resp:  [17-24] 18 (05/16 0700) BP: (102-142)/(55-75) 109/71 (05/16 0700) SpO2:  [100 %] 100 % (05/16 0700) Arterial Line BP: (109-147)/(57-72) 121/66 (05/16 0700) FiO2 (%):  [30 %] 30 % (05/16 0400)   Exam: General: intubated/sedated HEENT: pressure dressing in place, clean/dry. No obvious signs of hematoma formation.  Assessment: S/p left scalp laceration repair.   **RECOMMENDATIONS: 1. Plan to continue pressure dressing for another 24 hours. 2. Recommend antibiotic coverage due to contaminated scalp wound with cephalosporin for 7 days.  Vivia EwingJustin Lashanta Elbe, DMD Cell: [phone] 04/09/2017

## 2017-04-09 NOTE — Progress Notes (Signed)
Current Aspen collar is a poor fit Paged Ortho Tech for adjustable aspen collar
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
## 2017-04-09 NOTE — Progress Notes (Addendum)
After doing well initially, the patient is now struggling.  This is not totally surprising as she is not able to cough  Or take a large tidal volume.  Can only get IS up to 250  Will check ABG and CXR.  Trying quad coughing and possibly NTS.  If these do not improve things the last thing we will try before reintubation is BiPAP.  She has diminished breath sounds on the right.  This patient has been seen and I agree with the findings and treatment plan.  Marta LamasJames O. Gae BonWyatt, III, MD, FACS [phone] (pager) [phone] (direct pager) Trauma Surgeon
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
## 2017-04-09 NOTE — Progress Notes (Signed)
Wasted --43 ml Versed & 140 ml Fentanyl. Wasted in sink with Dorthula MatasKendra Jones, RN.

## 2017-04-09 NOTE — Procedures (Signed)
Extubation Procedure Note  Patient Details:   Name: Kylie Osborn DOB: 12/01/1995 MRN: [id number]   Airway Documentation:     Evaluation  O2 sats: stable throughout Complications: No apparent complications Patient did tolerate procedure well. Bilateral Breath Sounds: Diminished   Yes   Pt extubated to 4L [place].  No stridor noted.  RN @ bedside.  Christophe LouisSteven D Xcaret Morad 04/09/2017, 11:10 AM

## 2017-04-09 NOTE — Progress Notes (Signed)
Noted PT evaluation to assist with quad cough, positioning, etc. We will follow at a distance as we see how pt progresses with respiratory tolerance. Definitely will need intense SCI rehabilitation. [facility] which will allow more opportunities for her rehab regionally if pt and family would like to pursue that avenue. [phone]

## 2017-04-09 NOTE — Progress Notes (Signed)
Pt family member reporting pt complaints about c-collar, this RN awaiting biotech to switch to smaller size since 1245. Paged PT to assist pt w/ quad coughing as pt had difficulty trying to cough earlier in the afternoon. PT next to see pt on unit when pt family member notified staff that pt was complaining of difficulty breathing. SpO2 87-88% when this RN entered room. Incentive Spirometer 250 max. Placed on 4L [place] w/ minimal improvement. Noted congestion and secretions in airway.This RN encouraged pt to cough, unable to produce cough. Attempted to help clear secretions w/ yankauer without success. Baird Kayontacted Steve, RT to evaluate pt and paged Dr. Lindie SpruceWyatt, trauma MD. Dr. Lindie SpruceWyatt at bedside to discuss pt condition and plan of care. PT able to assist pt w/ quad coughing, SpO2 and quality of respirations improved. See assoc orders.

## 2017-04-09 NOTE — Progress Notes (Signed)
No acute events Able to wiggle toes on the left today; yesterday she was only able to adduct and abduct at the hip Aspen collar is poorly fitting despite multiple attempts to reposition; will replace Continue supportive care
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
## 2017-04-10 ENCOUNTER — Inpatient Hospital Stay (HOSPITAL_COMMUNITY): Payer: Commercial Managed Care - HMO

## 2017-04-10 ENCOUNTER — Encounter (HOSPITAL_COMMUNITY): Payer: Self-pay | Admitting: Orthopedic Surgery

## 2017-04-10 LAB — TYPE AND SCREEN
ABO/RH(D): O POS
ANTIBODY SCREEN: NEGATIVE
UNIT DIVISION: 0
UNIT DIVISION: 0
UNIT DIVISION: 0
UNIT DIVISION: 0
UNIT DIVISION: 0
UNIT DIVISION: 0
Unit division: 0
Unit division: 0
Unit division: 0
Unit division: 0

## 2017-04-10 LAB — BPAM RBC
BLOOD PRODUCT EXPIRATION DATE: 201806012359
BLOOD PRODUCT EXPIRATION DATE: 201806072359
BLOOD PRODUCT EXPIRATION DATE: 201806072359
BLOOD PRODUCT EXPIRATION DATE: 201806082359
BLOOD PRODUCT EXPIRATION DATE: 201806082359
Blood Product Expiration Date: 201805312359
Blood Product Expiration Date: 201805312359
Blood Product Expiration Date: 201806012359
Blood Product Expiration Date: 201806072359
Blood Product Expiration Date: 201806072359
ISSUE DATE / TIME: 201805122232
ISSUE DATE / TIME: 201805122232
ISSUE DATE / TIME: 201805131921
ISSUE DATE / TIME: 201805131921
ISSUE DATE / TIME: 201805131921
ISSUE DATE / TIME: 201805131921
ISSUE DATE / TIME: 201805142020
ISSUE DATE / TIME: 201805142150
ISSUE DATE / TIME: 201805151800
ISSUE DATE / TIME: 201805160135
UNIT TYPE AND RH: 5100
UNIT TYPE AND RH: 5100
UNIT TYPE AND RH: 5100
UNIT TYPE AND RH: 5100
UNIT TYPE AND RH: 9500
Unit Type and Rh: 5100
Unit Type and Rh: 5100
Unit Type and Rh: 5100
Unit Type and Rh: 5100
Unit Type and Rh: 9500

## 2017-04-10 LAB — BASIC METABOLIC PANEL
Anion gap: 8 (ref 5–15)
BUN: 12 mg/dL (ref 6–20)
CHLORIDE: 112 mmol/L — AB (ref 101–111)
CO2: 24 mmol/L (ref 22–32)
Calcium: 7.9 mg/dL — ABNORMAL LOW (ref 8.9–10.3)
Creatinine, Ser: 0.66 mg/dL (ref 0.44–1.00)
GFR calc Af Amer: >60 mL/min (ref >60)
GFR calc non Af Amer: >60 mL/min (ref >60)
Glucose, Bld: 76 mg/dL (ref 65–99)
POTASSIUM: 3.4 mmol/L — AB (ref 3.5–5.1)
SODIUM: 144 mmol/L (ref 135–145)

## 2017-04-10 LAB — CBC WITH DIFFERENTIAL/PLATELET
Basophils Absolute: 0 10*3/uL (ref 0.0–0.1)
Basophils Relative: 0 %
EOS ABS: 0 10*3/uL (ref 0.0–0.7)
EOS PCT: 0 %
HCT: 26.4 % — ABNORMAL LOW (ref 36.0–46.0)
HEMOGLOBIN: 8.6 g/dL — AB (ref 12.0–15.0)
Lymphocytes Relative: 16 %
Lymphs Abs: 1.7 10*3/uL (ref 0.7–4.0)
MCH: 31.6 pg (ref 26.0–34.0)
MCHC: 32.6 g/dL (ref 30.0–36.0)
MCV: 97.1 fL (ref 78.0–100.0)
MONOS PCT: 7 %
Monocytes Absolute: 0.8 10*3/uL (ref 0.1–1.0)
NEUTROS ABS: 8.1 10*3/uL — AB (ref 1.7–7.7)
Neutrophils Relative %: 77 %
Platelets: 232 10*3/uL (ref 150–400)
RBC: 2.72 MIL/uL — AB (ref 3.87–5.11)
RDW: 15 % (ref 11.5–15.5)
WBC: 10.6 10*3/uL — ABNORMAL HIGH (ref 4.0–10.5)

## 2017-04-10 LAB — BLOOD GAS, ARTERIAL
ACID-BASE EXCESS: 0.2 mmol/L (ref 0.0–2.0)
Bicarbonate: 23.9 mmol/L (ref 20.0–28.0)
O2 Content: 5 L/min
O2 SAT: 88.5 %
PH ART: 7.432 (ref 7.350–7.450)
Patient temperature: 98.6
pCO2 arterial: 36.5 mmHg (ref 32.0–48.0)
pO2, Arterial: 56.3 mmHg — ABNORMAL LOW (ref 83.0–108.0)

## 2017-04-10 MED ORDER — ACETAMINOPHEN 325 MG PO TABS
650.0000 mg | ORAL_TABLET | Freq: Four times a day (QID) | ORAL | Status: DC | PRN
  Administered 2017-04-11 – 2017-04-17 (×3): 650 mg via ORAL
  Filled 2017-04-10 (×3): qty 2

## 2017-04-10 MED ORDER — IPRATROPIUM-ALBUTEROL 0.5-2.5 (3) MG/3ML IN SOLN
3.0000 mL | RESPIRATORY_TRACT | Status: DC | PRN
  Administered 2017-04-10: 3 mL via RESPIRATORY_TRACT
  Filled 2017-04-10: qty 3

## 2017-04-10 MED ORDER — FENTANYL CITRATE (PF) 100 MCG/2ML IJ SOLN
25.0000 ug | INTRAMUSCULAR | Status: DC | PRN
  Administered 2017-04-10 – 2017-04-11 (×11): 50 ug via INTRAVENOUS
  Administered 2017-04-11: 100 ug via INTRAVENOUS
  Filled 2017-04-10 (×13): qty 2

## 2017-04-10 MED ORDER — IPRATROPIUM-ALBUTEROL 0.5-2.5 (3) MG/3ML IN SOLN
3.0000 mL | RESPIRATORY_TRACT | Status: DC
  Filled 2017-04-10: qty 3

## 2017-04-10 MED ORDER — LORAZEPAM 2 MG/ML IJ SOLN
0.5000 mg | INTRAMUSCULAR | Status: DC | PRN
  Administered 2017-04-10 (×3): 1 mg via INTRAVENOUS
  Administered 2017-04-10: 0.5 mg via INTRAVENOUS
  Administered 2017-04-11 – 2017-04-18 (×3): 1 mg via INTRAVENOUS
  Filled 2017-04-10 (×8): qty 1

## 2017-04-10 MED ORDER — DEXTROSE 5 % IV SOLN
1.0000 g | Freq: Three times a day (TID) | INTRAVENOUS | Status: DC
  Administered 2017-04-10 – 2017-04-18 (×24): 1 g via INTRAVENOUS
  Filled 2017-04-10 (×25): qty 1

## 2017-04-10 MED ORDER — ACETAMINOPHEN 650 MG RE SUPP
650.0000 mg | Freq: Four times a day (QID) | RECTAL | Status: DC | PRN
  Administered 2017-04-10: 650 mg via RECTAL
  Filled 2017-04-10: qty 1

## 2017-04-10 MED ORDER — BACITRACIN ZINC 500 UNIT/GM EX OINT
TOPICAL_OINTMENT | Freq: Two times a day (BID) | CUTANEOUS | Status: DC
  Administered 2017-04-10 – 2017-04-11 (×3): 1 via TOPICAL
  Administered 2017-04-11 – 2017-04-12 (×2): via TOPICAL
  Administered 2017-04-12 – 2017-04-13 (×2): 1 via TOPICAL
  Administered 2017-04-13: 23:00:00 via TOPICAL
  Administered 2017-04-14: 1 via TOPICAL
  Administered 2017-04-14 – 2017-04-15 (×2): via TOPICAL
  Administered 2017-04-15 – 2017-04-16 (×2): 1 via TOPICAL
  Administered 2017-04-16 – 2017-04-17 (×3): via TOPICAL
  Filled 2017-04-10: qty 28.35

## 2017-04-10 MED ORDER — ENOXAPARIN SODIUM 40 MG/0.4ML ~~LOC~~ SOLN
40.0000 mg | Freq: Every day | SUBCUTANEOUS | Status: DC
  Administered 2017-04-10 – 2017-04-17 (×7): 40 mg via SUBCUTANEOUS
  Filled 2017-04-10 (×7): qty 0.4

## 2017-04-10 NOTE — Progress Notes (Signed)
Pt temp 102. MD notified, new orders given- see MAR.

## 2017-04-10 NOTE — Progress Notes (Signed)
Stopped by to visit w/ pt, but PT was there and pt's mom, who is here today, was not. Will try again another time.   04/10/17 1100  Clinical Encounter Type  Visited With Patient not available  Visit Type Follow-up;Psychological support;Spiritual support;Social support  Referral From Chaplain   Ephraim Hamburgerynthia A Emmalina Espericueta, [address]

## 2017-04-10 NOTE — Progress Notes (Signed)
Pt temp 101.5, MD notified. Verbal order to notify MD if temp reaches 102.
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
## 2017-04-10 NOTE — Progress Notes (Signed)
Patient ID: Lesia HausenJada M Colson, female   DOB: 07/09/1996, 21 y.o.   MRN: [id number]   Subjective/Chief Complaint: 21 y/o F POD2 s/p debridement and closure of left scalp laceration. Dressing was changed yesterday morning by nursing staff. Patient now extubated, able to mouth words.   Objective: General: awake/alert  HEENT: pressure dressing removed - not saterated, clean/dry. No obvious signs of hematoma formation. Wound c/d/i  Assessment: S/p left scalp laceration repair.   **RECOMMENDATIONS: 1. Recommend antibiotic coverage due to contaminated scalp wound with cephalosporin for 7 days. 2. Clean scalp wound with 3:1 ratio saline:hydrogen peroxide with q-tip applicator twice daily, then coat with Bacitracin ointment afterwards. Cover with telfa dressing. 3. Plan for staple/suture removal in 7-10 days.  Vivia EwingJustin Talisa Petrak, DMD Cell: [phone] 04/09/2017
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
## 2017-04-10 NOTE — Progress Notes (Signed)
Extubated yesterday Breathing comfortably Mouthing words Neurological function unchanged from yesterday Collar fits much better Check lateral cervical xray today
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
## 2017-04-11 ENCOUNTER — Inpatient Hospital Stay (HOSPITAL_COMMUNITY): Payer: Commercial Managed Care - HMO | Admitting: Anesthesiology

## 2017-04-11 ENCOUNTER — Inpatient Hospital Stay (HOSPITAL_COMMUNITY): Payer: Commercial Managed Care - HMO

## 2017-04-11 LAB — BLOOD GAS, ARTERIAL
ACID-BASE DEFICIT: 1.9 mmol/L (ref 0.0–2.0)
Bicarbonate: 22 mmol/L (ref 20.0–28.0)
FIO2: 0.8
O2 Saturation: 98.8 %
PEEP: 8 cmH2O
Patient temperature: 98.6
RATE: 18 resp/min
VT: 490 mL
pCO2 arterial: 35.3 mmHg (ref 32.0–48.0)
pH, Arterial: 7.412 (ref 7.350–7.450)
pO2, Arterial: 217 mmHg — ABNORMAL HIGH (ref 83.0–108.0)

## 2017-04-11 LAB — POCT I-STAT 3, ART BLOOD GAS (G3+)
ACID-BASE DEFICIT: 1 mmol/L (ref 0.0–2.0)
BICARBONATE: 23.4 mmol/L (ref 20.0–28.0)
O2 SAT: 94 %
PCO2 ART: 38.3 mmHg (ref 32.0–48.0)
PO2 ART: 72 mmHg — AB (ref 83.0–108.0)
TCO2: 25 mmol/L (ref 0–100)
pH, Arterial: 7.394 (ref 7.350–7.450)

## 2017-04-11 LAB — CBC
HEMATOCRIT: 26.9 % — AB (ref 36.0–46.0)
HEMOGLOBIN: 9 g/dL — AB (ref 12.0–15.0)
MCH: 31.9 pg (ref 26.0–34.0)
MCHC: 33.5 g/dL (ref 30.0–36.0)
MCV: 95.4 fL (ref 78.0–100.0)
Platelets: 216 10*3/uL (ref 150–400)
RBC: 2.82 MIL/uL — ABNORMAL LOW (ref 3.87–5.11)
RDW: 13.8 % (ref 11.5–15.5)
WBC: 10.6 10*3/uL — AB (ref 4.0–10.5)

## 2017-04-11 LAB — BASIC METABOLIC PANEL
ANION GAP: 13 (ref 5–15)
BUN: 6 mg/dL (ref 6–20)
CALCIUM: 8.1 mg/dL — AB (ref 8.9–10.3)
CHLORIDE: 103 mmol/L (ref 101–111)
CO2: 20 mmol/L — AB (ref 22–32)
Creatinine, Ser: 0.51 mg/dL (ref 0.44–1.00)
GFR calc non Af Amer: >60 mL/min (ref >60)
Glucose, Bld: 69 mg/dL (ref 65–99)
Potassium: 3.1 mmol/L — ABNORMAL LOW (ref 3.5–5.1)
Sodium: 136 mmol/L (ref 135–145)

## 2017-04-11 LAB — URINE CULTURE
Culture: 20000 — AB
Special Requests: NORMAL

## 2017-04-11 LAB — GLUCOSE, CAPILLARY
GLUCOSE-CAPILLARY: 92 mg/dL (ref 65–99)
GLUCOSE-CAPILLARY: 151 mg/dL — AB (ref 65–99)

## 2017-04-11 LAB — MAGNESIUM: MAGNESIUM: 1.7 mg/dL (ref 1.7–2.4)

## 2017-04-11 LAB — TRIGLYCERIDES: Triglycerides: 151 mg/dL — ABNORMAL HIGH (ref <150)

## 2017-04-11 LAB — PHOSPHORUS: Phosphorus: 3.6 mg/dL (ref 2.5–4.6)

## 2017-04-11 MED ORDER — POTASSIUM CHLORIDE 10 MEQ/50ML IV SOLN
10.0000 meq | INTRAVENOUS | Status: AC
  Administered 2017-04-11 (×3): 10 meq via INTRAVENOUS
  Filled 2017-04-11 (×3): qty 50

## 2017-04-11 MED ORDER — CHLORHEXIDINE GLUCONATE CLOTH 2 % EX PADS
6.0000 | MEDICATED_PAD | Freq: Every day | CUTANEOUS | Status: DC
  Administered 2017-04-11 – 2017-04-16 (×6): 6 via TOPICAL

## 2017-04-11 MED ORDER — PROPOFOL 10 MG/ML IV BOLUS
INTRAVENOUS | Status: DC | PRN
  Administered 2017-04-11: 70 mg via INTRAVENOUS
  Administered 2017-04-11: 40 mg via INTRAVENOUS
  Administered 2017-04-11: 20 mg via INTRAVENOUS

## 2017-04-11 MED ORDER — MIDAZOLAM HCL 2 MG/2ML IJ SOLN
2.0000 mg | Freq: Once | INTRAMUSCULAR | Status: AC
  Administered 2017-04-11: 2 mg via INTRAVENOUS

## 2017-04-11 MED ORDER — CHLORHEXIDINE GLUCONATE CLOTH 2 % EX PADS: 6.0000 | MEDICATED_PAD | Freq: Every day | CUTANEOUS | Status: DC

## 2017-04-11 MED ORDER — PROPOFOL 10 MG/ML IV BOLUS
INTRAVENOUS | Status: DC | PRN
Start: 2017-04-11 — End: 2017-04-11

## 2017-04-11 MED ORDER — SODIUM CHLORIDE 0.9% FLUSH
10.0000 mL | Freq: Two times a day (BID) | INTRAVENOUS | Status: DC
  Administered 2017-04-11: 10 mL
  Administered 2017-04-12: 20 mL
  Administered 2017-04-12 – 2017-04-14 (×4): 10 mL
  Administered 2017-04-14: 20 mL
  Administered 2017-04-15: 10 mL
  Administered 2017-04-15: 20 mL
  Administered 2017-04-16 – 2017-04-17 (×2): 10 mL

## 2017-04-11 MED ORDER — PROPOFOL 1000 MG/100ML IV EMUL
5.0000 ug/kg/min | INTRAVENOUS | Status: DC
  Administered 2017-04-11: 5 ug/kg/min via INTRAVENOUS
  Administered 2017-04-11: 50 ug/kg/min via INTRAVENOUS
  Filled 2017-04-11 (×2): qty 100

## 2017-04-11 MED ORDER — SODIUM CHLORIDE 0.9 % IV BOLUS (SEPSIS)
500.0000 mL | Freq: Once | INTRAVENOUS | Status: AC
  Administered 2017-04-11: 500 mL via INTRAVENOUS

## 2017-04-11 MED ORDER — PIVOT 1.5 CAL PO LIQD
1000.0000 mL | ORAL | Status: DC
  Administered 2017-04-11 (×2): 1000 mL
  Filled 2017-04-11 (×2): qty 1000

## 2017-04-11 MED ORDER — FENTANYL 2500MCG IN NS 250ML (10MCG/ML) PREMIX INFUSION
25.0000 ug/h | INTRAVENOUS | Status: DC
  Administered 2017-04-11: 50 ug/h via INTRAVENOUS
  Administered 2017-04-12: 250 ug/h via INTRAVENOUS
  Administered 2017-04-12: 200 ug/h via INTRAVENOUS
  Administered 2017-04-12: 250 ug/h via INTRAVENOUS
  Administered 2017-04-13: 175 ug/h via INTRAVENOUS
  Administered 2017-04-14 (×2): 225 ug/h via INTRAVENOUS
  Administered 2017-04-15: 125 ug/h via INTRAVENOUS
  Administered 2017-04-16 (×2): 50 ug/h via INTRAVENOUS
  Administered 2017-04-16: 275 ug/h via INTRAVENOUS
  Administered 2017-04-16 (×4): 50 ug/h via INTRAVENOUS
  Administered 2017-04-16: 100 ug/h via INTRAVENOUS
  Administered 2017-04-16 (×4): 50 ug/h via INTRAVENOUS
  Administered 2017-04-16: 150 ug/h via INTRAVENOUS
  Administered 2017-04-16: 175 ug/h via INTRAVENOUS
  Administered 2017-04-16: 50 ug/h via INTRAVENOUS
  Administered 2017-04-17 (×2): 250 ug/h via INTRAVENOUS
  Administered 2017-04-18 (×2): 300 ug/h via INTRAVENOUS
  Filled 2017-04-11 (×17): qty 250

## 2017-04-11 MED ORDER — PROPOFOL 1000 MG/100ML IV EMUL
INTRAVENOUS | Status: AC
  Filled 2017-04-11: qty 100

## 2017-04-11 MED ORDER — MIDAZOLAM HCL 2 MG/2ML IJ SOLN
INTRAMUSCULAR | Status: AC
  Filled 2017-04-11: qty 4

## 2017-04-11 MED ORDER — SODIUM CHLORIDE 0.9 % IV SOLN
1.0000 mg/h | INTRAVENOUS | Status: DC
  Administered 2017-04-11: 2 mg/h via INTRAVENOUS
  Administered 2017-04-12: 5 mg/h via INTRAVENOUS
  Administered 2017-04-12: 2 mg/h via INTRAVENOUS
  Administered 2017-04-13: 3 mg/h via INTRAVENOUS
  Administered 2017-04-13: 5 mg/h via INTRAVENOUS
  Administered 2017-04-14: 7 mg/h via INTRAVENOUS
  Administered 2017-04-14: 5 mg/h via INTRAVENOUS
  Administered 2017-04-15 – 2017-04-16 (×4): 6 mg/h via INTRAVENOUS
  Administered 2017-04-16 (×5): 2 mg/h via INTRAVENOUS
  Administered 2017-04-16: 4 mg/h via INTRAVENOUS
  Administered 2017-04-16 (×3): 2 mg/h via INTRAVENOUS
  Administered 2017-04-16: 6 mg/h via INTRAVENOUS
  Administered 2017-04-16: 5 mg/h via INTRAVENOUS
  Administered 2017-04-16 (×3): 2 mg/h via INTRAVENOUS
  Administered 2017-04-17: 6 mg/h via INTRAVENOUS
  Administered 2017-04-17: 7 mg/h via INTRAVENOUS
  Administered 2017-04-17 – 2017-04-18 (×2): 6 mg/h via INTRAVENOUS
  Filled 2017-04-11 (×17): qty 10

## 2017-04-11 MED ORDER — FENTANYL CITRATE (PF) 100 MCG/2ML IJ SOLN: 50.0000 ug | Freq: Once | INTRAMUSCULAR | Status: AC

## 2017-04-11 MED ORDER — POTASSIUM CHLORIDE 10 MEQ/50ML IV SOLN
10.0000 meq | INTRAVENOUS | Status: DC
  Administered 2017-04-11: 10 meq via INTRAVENOUS
  Filled 2017-04-11: qty 50

## 2017-04-11 MED ORDER — FENTANYL BOLUS VIA INFUSION
50.0000 ug | INTRAVENOUS | Status: DC | PRN
  Administered 2017-04-15 – 2017-04-16 (×3): 50 ug via INTRAVENOUS
  Filled 2017-04-11: qty 50

## 2017-04-11 MED ORDER — PRO-STAT SUGAR FREE PO LIQD
30.0000 mL | Freq: Two times a day (BID) | ORAL | Status: DC
  Administered 2017-04-11 – 2017-04-12 (×3): 30 mL
  Filled 2017-04-11 (×3): qty 30

## 2017-04-11 MED ORDER — ROCURONIUM BROMIDE 100 MG/10ML IV SOLN
INTRAVENOUS | Status: DC | PRN
  Administered 2017-04-11: 50 mg via INTRAVENOUS

## 2017-04-11 MED ORDER — BENZOCAINE (TOPICAL) 20 % EX AERO
1.0000 "application " | INHALATION_SPRAY | Freq: Four times a day (QID) | CUTANEOUS | Status: DC | PRN
  Administered 2017-04-11 – 2017-04-16 (×3): 1 via OROMUCOSAL
  Filled 2017-04-11: qty 1

## 2017-04-11 MED ORDER — SODIUM CHLORIDE 0.9% FLUSH: 10.0000 mL | INTRAVENOUS | Status: DC | PRN

## 2017-04-11 MED ORDER — FENTANYL CITRATE (PF) 100 MCG/2ML IJ SOLN: 100.0000 ug | Freq: Once | INTRAMUSCULAR | Status: AC

## 2017-04-11 MED FILL — Benzocaine Aerosol 20%: CUTANEOUS | Qty: 57 | Status: AC

## 2017-04-11 NOTE — Progress Notes (Signed)
Cortrak Tube Team Note:  Consult received to place a Cortrak feeding tube.   A 10 F Cortrak tube was placed in the R nare and secured with a nasal bridle at 73 cm. Per the Cortrak monitor reading the tube tip is in the stomach.   No x-ray is required. RN may begin using tube.   If the tube becomes dislodged please keep the tube and contact the Cortrak team at [email or web address] (password [email or web address]) for replacement.  If after hours and replacement cannot be delayed, place a NG tube and confirm placement with an abdominal x-ray.      Trenton GammonJessica Suha Schoenbeck, MS, RD, LDN, [facility] Inpatient Clinical Dietitian Pager # [phone] After hours/weekend pager # [phone]

## 2017-04-11 NOTE — Progress Notes (Signed)
   04/11/17 1255  Clinical Encounter Type  Visited With Patient and family together  Visit Type Follow-up  Spiritual Encounters  Spiritual Needs Prayer  Stress Factors  Patient Stress Factors Health changes  Family Stress Factors Family relationships  Introduction to mother as Pt intubated. Offered prayer of peace and comfort.

## 2017-04-11 NOTE — Anesthesia Procedure Notes (Signed)
Procedure Name: Intubation Date/Time: 04/11/2017 8:10 AM Performed by: Edmonia CaprioAUSTON, Alanie Syler M Pre-anesthesia Checklist: Patient identified, Emergency Drugs available, Suction available, Patient being monitored and Timeout performed Patient Re-evaluated:Patient Re-evaluated prior to inductionOxygen Delivery Method: Circle system utilized Preoxygenation: Pre-oxygenation with 100% oxygen Intubation Type: IV induction Ventilation: Mask ventilation without difficulty Laryngoscope Size: Glidescope and 3 Grade View: Grade I Tube type: Subglottic suction tube Tube size: 8.0 mm Number of attempts: 1 Airway Equipment and Method: Stylet Placement Confirmation: ETT inserted through vocal cords under direct vision,  positive ETCO2 and breath sounds checked- equal and bilateral Secured at: 22 cm Tube secured with: Tape Dental Injury: Teeth and Oropharynx as per pre-operative assessment  Difficulty Due To: Difficult Airway- due to cervical collar and Difficult Airway-  due to neck instability Comments: Neck collar in place for intubation/ neck maintained neutral.

## 2017-04-11 NOTE — Progress Notes (Signed)
PT Cancellation Note  Patient Details Name: Lesia HausenJada M Gregori MRN: [id number] DOB: 11/11/1996   Cancelled Treatment:    Reason Eval/Treat Not Completed: Patient not medically ready Pt re-intubated, s/p PEG and bronchoscopy. RN requests to hold PT today. Will follow up.   Blake DivineShauna A Massie Mees 04/11/2017, 10:33 AM Mylo RedShauna Josefina Rynders, PT, DPT [phone]

## 2017-04-11 NOTE — Progress Notes (Signed)
   04/11/17 0940  Clinical Encounter Type  Visited With Patient not available  Visit Type Other (Comment) ([place] consult)  Spiritual Encounters  Spiritual Needs Emotional  Stress Factors  Patient Stress Factors Not reviewed  Pt undergoing procedure. Check back later.
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
## 2017-04-11 NOTE — Progress Notes (Signed)
SLP Cancellation Note  Patient Details Name: Lesia HausenJada M Lassalle MRN: [id number] DOB: 01/27/1996   Cancelled treatment:       Reason Eval/Treat Not Completed: Medical issues which prohibited therapy - pt reintubated. Will f/u as able.   Maxcine Hamaiewonsky, Brigida Scotti 04/11/2017, 8:45 AM  Maxcine HamLaura Paiewonsky, M.A. CCC-SLP [phone]
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
## 2017-04-11 NOTE — Procedures (Addendum)
Bronchoscopy Procedure Note Kylie HausenJada M Osborn [id number] 07/31/1996  Procedure: Bronchoscopy with lavage Indications: Obtain specimens for culture and/or other diagnostic studies and Remove secretions  Procedure Details Consent: Risks of procedure as well as the alternatives and risks of each were explained to the (patient/caregiver).  Consent for procedure obtained. Time Out: Verified patient identification, verified procedure, site/side was marked, verified correct patient position, special equipment/implants available, medications/allergies/relevent history reviewed, required imaging and test results available.  Performed  In preparation for procedure, patient was given 100% FiO2 and bronchoscope lubricated. Sedation: Benzodiazepines, Muscle relaxants and fentanyl  Airway entered and the following bronchi were examined: RUL, RML, RLL, LUL and Bronchi.   Procedures performed: Lavage performed. Bronchoscope removed.  , Patient placed back on 100% FiO2 at conclusion of procedure.    Evaluation Hemodynamic Status: BP stable throughout; O2 sats: stable throughout and currently acceptable Patient's Current Condition: stable Specimens:  Sent purulent fluid Complications: No apparent complications Patient did tolerate procedure well.   Kylie Osborn 04/11/2017

## 2017-04-11 NOTE — Progress Notes (Signed)
OT Cancellation Note  Patient Details Name: Kylie Osborn MRN: [id number] DOB: 06/23/1996   Cancelled Treatment:    Reason Eval/Treat Not Completed: Medical issues which prohibited therapy.  Pt now intubated.  RN requests therapies hold today.  Will check back early next week.  Orlander Norwood [place], OTR/L [phone]   Kylie Osborn, Kylie Osborn 04/11/2017, 10:45 AM

## 2017-04-11 NOTE — Progress Notes (Signed)
I met with pt's mother to discuss possible [facility] referral.  Therapist, sports and contact name and phone # of admissions liaison given.  She has many questions, and is understandably overwhelmed about what to do.  She has an 20 year old son, and is concerned about how he will be cared for if she is in [place], as her husband works.  I advised to rally close friends and family to help her with these issues, because people really do care and want to help her and her family at this time.  She is tearful.  She would like to find out what the insurance will cover prior to making a decision.   I spoke with Doran Durand, admissions liaison for [facility], and she states she can check benefits after referral is made.  Will fax referral to [facility] admissions liaison, Doran Durand, fax # [phone].    Reinaldo Raddle, RN, BSN  Trauma/Neuro ICU Case Manager [phone]
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
## 2017-04-11 NOTE — Progress Notes (Signed)
Peripherally Inserted Central Catheter/Midline Placement  The IV Nurse has discussed with the patient and/or persons authorized to consent for the patient, the purpose of this procedure and the potential benefits and risks involved with this procedure.  The benefits include less needle sticks, lab draws from the catheter, and the patient may be discharged home with the catheter. Risks include, but not limited to, infection, bleeding, blood clot (thrombus formation), and puncture of an artery; nerve damage and irregular heartbeat and possibility to perform a PICC exchange if needed/ordered by physician.  Alternatives to this procedure were also discussed.  Bard Power PICC patient education guide, fact sheet on infection prevention and patient information card has been provided to patient /or left at bedside.    PICC/Midline Placement Documentation    Mother at bedside informed and consented .    Franne Gripewman, Zhamir Pirro Renee 04/11/2017, 9:58 AM

## 2017-04-12 ENCOUNTER — Inpatient Hospital Stay (HOSPITAL_COMMUNITY): Payer: Commercial Managed Care - HMO

## 2017-04-12 LAB — BASIC METABOLIC PANEL
ANION GAP: 5 (ref 5–15)
Anion gap: 7 (ref 5–15)
BUN: 9 mg/dL (ref 6–20)
BUN: 9 mg/dL (ref 6–20)
CHLORIDE: 106 mmol/L (ref 101–111)
CHLORIDE: 108 mmol/L (ref 101–111)
CO2: 25 mmol/L (ref 22–32)
CO2: 25 mmol/L (ref 22–32)
CREATININE: 0.41 mg/dL — AB (ref 0.44–1.00)
Calcium: 7.6 mg/dL — ABNORMAL LOW (ref 8.9–10.3)
Calcium: 7.5 mg/dL — ABNORMAL LOW (ref 8.9–10.3)
Creatinine, Ser: 0.41 mg/dL — ABNORMAL LOW (ref 0.44–1.00)
GLUCOSE: 159 mg/dL — AB (ref 65–99)
Glucose, Bld: 165 mg/dL — ABNORMAL HIGH (ref 65–99)
POTASSIUM: 3 mmol/L — AB (ref 3.5–5.1)
Potassium: 2.9 mmol/L — ABNORMAL LOW (ref 3.5–5.1)
SODIUM: 138 mmol/L (ref 135–145)
Sodium: 138 mmol/L (ref 135–145)

## 2017-04-12 LAB — CBC
HEMATOCRIT: 23 % — AB (ref 36.0–46.0)
Hemoglobin: 7.7 g/dL — ABNORMAL LOW (ref 12.0–15.0)
MCH: 31.3 pg (ref 26.0–34.0)
MCHC: 33.5 g/dL (ref 30.0–36.0)
MCV: 93.5 fL (ref 78.0–100.0)
PLATELETS: 279 10*3/uL (ref 150–400)
RBC: 2.46 MIL/uL — AB (ref 3.87–5.11)
RDW: 14.2 % (ref 11.5–15.5)
WBC: 9.3 10*3/uL (ref 4.0–10.5)

## 2017-04-12 LAB — CBC WITH DIFFERENTIAL/PLATELET
Basophils Absolute: 0 10*3/uL (ref 0.0–0.1)
Basophils Relative: 0 %
EOS ABS: 0.1 10*3/uL (ref 0.0–0.7)
EOS PCT: 1 %
HCT: 19.5 % — ABNORMAL LOW (ref 36.0–46.0)
Hemoglobin: 6.7 g/dL — CL (ref 12.0–15.0)
LYMPHS ABS: 2.2 10*3/uL (ref 0.7–4.0)
Lymphocytes Relative: 23 %
MCH: 32.1 pg (ref 26.0–34.0)
MCHC: 34.4 g/dL (ref 30.0–36.0)
MCV: 93.3 fL (ref 78.0–100.0)
Monocytes Absolute: 0.5 10*3/uL (ref 0.1–1.0)
Monocytes Relative: 5 %
Neutro Abs: 6.8 10*3/uL (ref 1.7–7.7)
Neutrophils Relative %: 71 %
PLATELETS: 278 10*3/uL (ref 150–400)
RBC: 2.09 MIL/uL — AB (ref 3.87–5.11)
RDW: 14.2 % (ref 11.5–15.5)
WBC: 9.6 10*3/uL (ref 4.0–10.5)

## 2017-04-12 LAB — GLUCOSE, CAPILLARY
GLUCOSE-CAPILLARY: 159 mg/dL — AB (ref 65–99)
GLUCOSE-CAPILLARY: 154 mg/dL — AB (ref 65–99)
GLUCOSE-CAPILLARY: 142 mg/dL — AB (ref 65–99)
GLUCOSE-CAPILLARY: 129 mg/dL — AB (ref 65–99)
Glucose-Capillary: 138 mg/dL — ABNORMAL HIGH (ref 65–99)
Glucose-Capillary: 159 mg/dL — ABNORMAL HIGH (ref 65–99)
Glucose-Capillary: 183 mg/dL — ABNORMAL HIGH (ref 65–99)

## 2017-04-12 LAB — PHOSPHORUS
PHOSPHORUS: 2.3 mg/dL — AB (ref 2.5–4.6)
Phosphorus: 2.8 mg/dL (ref 2.5–4.6)

## 2017-04-12 LAB — MAGNESIUM
MAGNESIUM: 1.8 mg/dL (ref 1.7–2.4)
Magnesium: 1.7 mg/dL (ref 1.7–2.4)

## 2017-04-12 MED ORDER — POTASSIUM CHLORIDE 10 MEQ/50ML IV SOLN
10.0000 meq | INTRAVENOUS | Status: AC
  Administered 2017-04-12 (×4): 10 meq via INTRAVENOUS
  Filled 2017-04-12 (×4): qty 50

## 2017-04-12 MED ORDER — FLUCONAZOLE IN SODIUM CHLORIDE 200-0.9 MG/100ML-% IV SOLN
200.0000 mg | INTRAVENOUS | Status: AC
  Administered 2017-04-12 – 2017-04-14 (×3): 200 mg via INTRAVENOUS
  Filled 2017-04-12 (×3): qty 100

## 2017-04-12 MED ORDER — PIVOT 1.5 CAL PO LIQD
1000.0000 mL | ORAL | Status: DC
  Administered 2017-04-12 – 2017-04-17 (×6): 1000 mL
  Filled 2017-04-12 (×5): qty 1000
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
## 2017-04-13 LAB — BASIC METABOLIC PANEL
Anion gap: 5 (ref 5–15)
BUN: 9 mg/dL (ref 6–20)
CHLORIDE: 107 mmol/L (ref 101–111)
CO2: 25 mmol/L (ref 22–32)
Calcium: 7.7 mg/dL — ABNORMAL LOW (ref 8.9–10.3)
Creatinine, Ser: 0.43 mg/dL — ABNORMAL LOW (ref 0.44–1.00)
GFR calc Af Amer: >60 mL/min (ref >60)
GLUCOSE: 143 mg/dL — AB (ref 65–99)
POTASSIUM: 3.2 mmol/L — AB (ref 3.5–5.1)
SODIUM: 137 mmol/L (ref 135–145)

## 2017-04-13 LAB — CULTURE, RESPIRATORY: SPECIAL REQUESTS: NORMAL

## 2017-04-13 LAB — CULTURE, RESPIRATORY W GRAM STAIN: Culture: NORMAL

## 2017-04-13 LAB — GLUCOSE, CAPILLARY
GLUCOSE-CAPILLARY: 145 mg/dL — AB (ref 65–99)
GLUCOSE-CAPILLARY: 124 mg/dL — AB (ref 65–99)
Glucose-Capillary: 163 mg/dL — ABNORMAL HIGH (ref 65–99)
Glucose-Capillary: 152 mg/dL — ABNORMAL HIGH (ref 65–99)
Glucose-Capillary: 121 mg/dL — ABNORMAL HIGH (ref 65–99)
Glucose-Capillary: 108 mg/dL — ABNORMAL HIGH (ref 65–99)

## 2017-04-13 LAB — CULTURE, BAL-QUANTITATIVE W GRAM STAIN: Culture: 30000 — AB

## 2017-04-13 LAB — CULTURE, BAL-QUANTITATIVE

## 2017-04-13 LAB — MAGNESIUM: MAGNESIUM: 1.7 mg/dL (ref 1.7–2.4)

## 2017-04-13 LAB — PHOSPHORUS: PHOSPHORUS: 2.6 mg/dL (ref 2.5–4.6)
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
## 2017-04-14 LAB — GLUCOSE, CAPILLARY
GLUCOSE-CAPILLARY: 126 mg/dL — AB (ref 65–99)
GLUCOSE-CAPILLARY: 107 mg/dL — AB (ref 65–99)
Glucose-Capillary: 106 mg/dL — ABNORMAL HIGH (ref 65–99)
Glucose-Capillary: 94 mg/dL (ref 65–99)

## 2017-04-14 MED ORDER — POTASSIUM CHLORIDE 20 MEQ/15ML (10%) PO SOLN
20.0000 meq | Freq: Two times a day (BID) | ORAL | Status: AC
  Administered 2017-04-14 (×2): 20 meq via ORAL
  Filled 2017-04-14 (×2): qty 15

## 2017-04-14 MED ORDER — CHLORHEXIDINE GLUCONATE 0.12 % MT SOLN
OROMUCOSAL | Status: AC
  Administered 2017-04-14: 15 mL via OROMUCOSAL
  Filled 2017-04-14: qty 15
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
## 2017-04-14 NOTE — Progress Notes (Signed)
SLP Cancellation Note  Patient Details Name: Kylie Osborn MRN: [id number] DOB: 02/02/1996   Cancelled treatment:       Reason Eval/Treat Not Completed: Medical issues which prohibited therapy. Pt remains intubated - per chart, may consider trach/PEG. Will continue to follow.   Maxcine Hamaiewonsky, Tomoko Sandra 04/14/2017, 8:31 AM  Maxcine HamLaura Osborn, M.A. CCC-SLP [phone]
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
## 2017-04-14 NOTE — Progress Notes (Signed)
Better movement of the left lower extremity, approaching antigravity. Otherwise no motor function. Collar fitting well I do not foresee a need to fixate and fuse her anteriorly; ok to proceed with tracheostomy on Wednesday as planned

## 2017-04-15 ENCOUNTER — Inpatient Hospital Stay (HOSPITAL_COMMUNITY): Payer: Commercial Managed Care - HMO

## 2017-04-15 LAB — POCT I-STAT 3, ART BLOOD GAS (G3+)
Acid-Base Excess: 4 mmol/L — ABNORMAL HIGH (ref 0.0–2.0)
Bicarbonate: 29 mmol/L — ABNORMAL HIGH (ref 20.0–28.0)
O2 SAT: 99 %
PH ART: 7.384 (ref 7.350–7.450)
TCO2: 30 mmol/L (ref 0–100)
pCO2 arterial: 48.8 mmHg — ABNORMAL HIGH (ref 32.0–48.0)
pO2, Arterial: 158 mmHg — ABNORMAL HIGH (ref 83.0–108.0)

## 2017-04-15 LAB — CBC WITH DIFFERENTIAL/PLATELET
BASOS PCT: 0 %
Band Neutrophils: 18 %
Basophils Absolute: 0 10*3/uL (ref 0.0–0.1)
Blasts: 0 %
EOS PCT: 1 %
Eosinophils Absolute: 0.1 10*3/uL (ref 0.0–0.7)
HEMATOCRIT: 22 % — AB (ref 36.0–46.0)
Hemoglobin: 7.4 g/dL — ABNORMAL LOW (ref 12.0–15.0)
LYMPHS ABS: 3.2 10*3/uL (ref 0.7–4.0)
Lymphocytes Relative: 22 %
MCH: 31.4 pg (ref 26.0–34.0)
MCHC: 33.6 g/dL (ref 30.0–36.0)
MCV: 93.2 fL (ref 78.0–100.0)
MONO ABS: 0.9 10*3/uL (ref 0.1–1.0)
MONOS PCT: 6 %
Metamyelocytes Relative: 1 %
Myelocytes: 0 %
NEUTROS PCT: 50 %
NRBC: 0 /100{WBCs}
Neutro Abs: 10.2 10*3/uL — ABNORMAL HIGH (ref 1.7–7.7)
OTHER: 0 %
Platelets: 326 10*3/uL (ref 150–400)
Promyelocytes Absolute: 2 %
RBC: 2.36 MIL/uL — AB (ref 3.87–5.11)
RDW: 17.7 % — AB (ref 11.5–15.5)
WBC: 14.4 10*3/uL — AB (ref 4.0–10.5)

## 2017-04-15 LAB — CBC
HCT: 22 % — ABNORMAL LOW (ref 36.0–46.0)
HEMOGLOBIN: 7 g/dL — AB (ref 12.0–15.0)
MCH: 31 pg (ref 26.0–34.0)
MCHC: 31.8 g/dL (ref 30.0–36.0)
MCV: 97.3 fL (ref 78.0–100.0)
Platelets: 396 10*3/uL (ref 150–400)
RBC: 2.26 MIL/uL — ABNORMAL LOW (ref 3.87–5.11)
RDW: 15.2 % (ref 11.5–15.5)
WBC: 16.2 10*3/uL — AB (ref 4.0–10.5)

## 2017-04-15 LAB — GLUCOSE, CAPILLARY
GLUCOSE-CAPILLARY: 100 mg/dL — AB (ref 65–99)
GLUCOSE-CAPILLARY: 92 mg/dL (ref 65–99)
GLUCOSE-CAPILLARY: 93 mg/dL (ref 65–99)
Glucose-Capillary: 105 mg/dL — ABNORMAL HIGH (ref 65–99)
Glucose-Capillary: 111 mg/dL — ABNORMAL HIGH (ref 65–99)
Glucose-Capillary: 116 mg/dL — ABNORMAL HIGH (ref 65–99)
Glucose-Capillary: 134 mg/dL — ABNORMAL HIGH (ref 65–99)

## 2017-04-15 LAB — BASIC METABOLIC PANEL
Anion gap: 7 (ref 5–15)
BUN: 11 mg/dL (ref 6–20)
CHLORIDE: 102 mmol/L (ref 101–111)
CO2: 25 mmol/L (ref 22–32)
Calcium: 8.1 mg/dL — ABNORMAL LOW (ref 8.9–10.3)
Creatinine, Ser: 0.41 mg/dL — ABNORMAL LOW (ref 0.44–1.00)
Glucose, Bld: 126 mg/dL — ABNORMAL HIGH (ref 65–99)
POTASSIUM: 3.8 mmol/L (ref 3.5–5.1)
SODIUM: 134 mmol/L — AB (ref 135–145)

## 2017-04-15 LAB — PREPARE RBC (CROSSMATCH)

## 2017-04-15 MED ORDER — SODIUM CHLORIDE 0.9 % IV SOLN
Freq: Once | INTRAVENOUS | Status: AC
  Administered 2017-04-15: 11:00:00 via INTRAVENOUS

## 2017-04-15 MED ORDER — CHLORHEXIDINE GLUCONATE 0.12 % MT SOLN
OROMUCOSAL | Status: AC
  Filled 2017-04-15: qty 15

## 2017-04-15 MED ORDER — ESCITALOPRAM OXALATE 10 MG PO TABS
5.0000 mg | ORAL_TABLET | Freq: Every day | ORAL | Status: DC
  Administered 2017-04-15 – 2017-04-17 (×2): 5 mg
  Filled 2017-04-15 (×2): qty 1

## 2017-04-15 NOTE — Progress Notes (Addendum)
Pt's mom has officially decided to pursue transfer to [facility] [place], per our conversation this morning.  Patient  will need air ambulance transport.  I  reached out to her insurance company case Production designer, theatre/television/filmmanager for coverage information.  She states that air ambulance is covered with exception of $140 copay after precertification.     [employer] [facility] [employer] [phone]; spoke with [place].  She states they can start precertification with insurance for air ambulance to [facility] [place].  She will notify me when completed.     Hedy Maus, admissions liaison with [facility], will visit pt and family tomorrow; she plans to arrive around 12pm.  Pt's mother aware of visit.  Will follow up with updates as available.    Quintella BatonJulie W. Tenia Goh, RN, BSN  Trauma/Neuro ICU Case Manager [phone]
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
## 2017-04-15 NOTE — Progress Notes (Signed)
Patient ID: Kylie Osborn, female   DOB: 03/17/1996, 21 y.o.   MRN: [id number]  Asked by family to come by to assess dressing. They were concerned about the yellow color but I assured them it was Xeroform and supposed to be that color. We also discussed the possibility of retained glass in the fingers and hand and that we didn't need to worry about that at this point. Informed them that splint change and follow-up x-rays would take place in 1-3 weeks. They were appreciative.    Freeman CaldronMichael J. Nevelyn Mellott, PA-C Orthopedic Surgery [phone]

## 2017-04-15 NOTE — Progress Notes (Signed)
SLP Cancellation Note  Patient Details Name: Kylie Osborn MRN: [id number] DOB: 07/18/1996   Cancelled treatment:       Reason Eval/Treat Not Completed: Medical issues which prohibited therapy. Pt remains intubated with plans for trach/PEG tomorrow. Will f/u later this week.   Kylie Osborn, Kylie Osborn 04/15/2017, 8:39 AM  Kylie Osborn, M.A. CCC-SLP [phone]
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
## 2017-04-16 ENCOUNTER — Encounter (HOSPITAL_COMMUNITY): Admission: EM | Disposition: A | Discharge status: Rehabilitation Facility | Payer: Self-pay | Source: Home / Self Care

## 2017-04-16 ENCOUNTER — Inpatient Hospital Stay (HOSPITAL_COMMUNITY): Payer: Commercial Managed Care - HMO

## 2017-04-16 HISTORY — PX: PERCUTANEOUS TRACHEOSTOMY: SHX5288

## 2017-04-16 HISTORY — PX: PEG PLACEMENT: SHX5437

## 2017-04-16 HISTORY — PX: ESOPHAGOGASTRODUODENOSCOPY: SHX5428

## 2017-04-16 LAB — BPAM RBC
BLOOD PRODUCT EXPIRATION DATE: 201806132359
Blood Product Expiration Date: 201806142359
ISSUE DATE / TIME: 201805221105
ISSUE DATE / TIME: 201805221431
UNIT TYPE AND RH: 5100
UNIT TYPE AND RH: 5100

## 2017-04-16 LAB — GLUCOSE, CAPILLARY
GLUCOSE-CAPILLARY: 99 mg/dL (ref 65–99)
GLUCOSE-CAPILLARY: 139 mg/dL — AB (ref 65–99)
Glucose-Capillary: 89 mg/dL (ref 65–99)
Glucose-Capillary: 102 mg/dL — ABNORMAL HIGH (ref 65–99)
Glucose-Capillary: 89 mg/dL (ref 65–99)
Glucose-Capillary: 100 mg/dL — ABNORMAL HIGH (ref 65–99)

## 2017-04-16 LAB — TYPE AND SCREEN
ABO/RH(D): O POS
Antibody Screen: NEGATIVE
Unit division: 0
Unit division: 0

## 2017-04-16 LAB — CBC WITH DIFFERENTIAL/PLATELET
Basophils Absolute: 0 10*3/uL (ref 0.0–0.1)
Basophils Relative: 0 %
EOS PCT: 1 %
Eosinophils Absolute: 0.2 10*3/uL (ref 0.0–0.7)
HCT: 27.4 % — ABNORMAL LOW (ref 36.0–46.0)
Hemoglobin: 9.1 g/dL — ABNORMAL LOW (ref 12.0–15.0)
LYMPHS ABS: 2.1 10*3/uL (ref 0.7–4.0)
LYMPHS PCT: 11 %
MCH: 30.5 pg (ref 26.0–34.0)
MCHC: 33.2 g/dL (ref 30.0–36.0)
MCV: 91.9 fL (ref 78.0–100.0)
MONO ABS: 1.9 10*3/uL — AB (ref 0.1–1.0)
Monocytes Relative: 10 %
Neutro Abs: 14.4 10*3/uL — ABNORMAL HIGH (ref 1.7–7.7)
Neutrophils Relative %: 78 %
Platelets: 431 10*3/uL — ABNORMAL HIGH (ref 150–400)
RBC: 2.98 MIL/uL — ABNORMAL LOW (ref 3.87–5.11)
RDW: 17.2 % — AB (ref 11.5–15.5)
WBC: 18.5 10*3/uL — ABNORMAL HIGH (ref 4.0–10.5)

## 2017-04-16 SURGERY — CREATION, TRACHEOSTOMY, PERCUTANEOUS
Anesthesia: Choice | Site: Neck

## 2017-04-16 SURGERY — EGD (ESOPHAGOGASTRODUODENOSCOPY)

## 2017-04-16 MED ORDER — ORAL CARE MOUTH RINSE
15.0000 mL | Freq: Two times a day (BID) | OROMUCOSAL | Status: DC
  Administered 2017-04-16 (×2): 15 mL via OROMUCOSAL

## 2017-04-16 MED ORDER — LIDOCAINE-EPINEPHRINE (PF) 1.5 %-1:200000 IJ SOLN
INTRAMUSCULAR | Status: DC | PRN
  Administered 2017-04-16: 5 mL

## 2017-04-16 MED ORDER — POLYETHYLENE GLYCOL 3350 17 G PO PACK
17.0000 g | PACK | Freq: Every day | ORAL | Status: DC | PRN
  Administered 2017-04-17: 17 g via ORAL
  Filled 2017-04-16: qty 1

## 2017-04-16 MED ORDER — CHLORHEXIDINE GLUCONATE 0.12 % MT SOLN: 15.0000 mL | Freq: Two times a day (BID) | OROMUCOSAL | Status: DC

## 2017-04-16 MED ORDER — ORAL CARE MOUTH RINSE
15.0000 mL | OROMUCOSAL | Status: DC
  Administered 2017-04-16 – 2017-04-18 (×6): 15 mL via OROMUCOSAL

## 2017-04-16 MED ORDER — 0.9 % SODIUM CHLORIDE (POUR BTL) OPTIME
TOPICAL | Status: DC | PRN
  Administered 2017-04-16: 1000 mL

## 2017-04-16 MED ORDER — SODIUM CHLORIDE 0.9 % IV SOLN
0.0000 ug/min | INTRAVENOUS | Status: DC
  Administered 2017-04-16 (×2): 20 ug/min via INTRAVENOUS
  Administered 2017-04-17: 10 ug/min via INTRAVENOUS
  Administered 2017-04-17: 20 ug/min via INTRAVENOUS
  Administered 2017-04-17: 40 ug/min via INTRAVENOUS
  Administered 2017-04-18: 15 ug/min via INTRAVENOUS
  Filled 2017-04-16 (×6): qty 1

## 2017-04-16 MED ORDER — VECURONIUM BROMIDE 10 MG IV SOLR
10.0000 mg | Freq: Once | INTRAVENOUS | Status: AC
  Administered 2017-04-16: 10 mg via INTRAVENOUS
  Filled 2017-04-16: qty 10

## 2017-04-16 SURGICAL SUPPLY — 27 items
DRAPE HALF SHEET 40X57 (DRAPES) ×8 IMPLANT
DRAPE UTILITY XL STRL (DRAPES) ×2 IMPLANT
ELECT CAUTERY BLADE 6.4 (BLADE) ×2 IMPLANT
ELECT REM PT RETURN 9FT ADLT (ELECTROSURGICAL) ×2
ELECTRODE REM PT RTRN 9FT ADLT (ELECTROSURGICAL) ×1 IMPLANT
GAUZE SPONGE 4X4 16PLY XRAY LF (GAUZE/BANDAGES/DRESSINGS) ×2 IMPLANT
GLOVE BIO SURGEON STRL SZ 6 (GLOVE) IMPLANT
GLOVE BIO SURGEON STRL SZ8 (GLOVE) IMPLANT
GLOVE BIOGEL PI IND STRL 6.5 (GLOVE) IMPLANT
GLOVE BIOGEL PI IND STRL 8 (GLOVE) ×1 IMPLANT
GLOVE BIOGEL PI INDICATOR 6.5 (GLOVE)
GLOVE BIOGEL PI INDICATOR 8 (GLOVE) ×1
GLOVE ECLIPSE 7.5 STRL STRAW (GLOVE) ×2 IMPLANT
GOWN STRL REUS W/ TWL LRG LVL3 (GOWN DISPOSABLE) ×2 IMPLANT
GOWN STRL REUS W/ TWL XL LVL3 (GOWN DISPOSABLE) IMPLANT
GOWN STRL REUS W/TWL LRG LVL3 (GOWN DISPOSABLE) ×4
GOWN STRL REUS W/TWL XL LVL3 (GOWN DISPOSABLE)
INTRODUCER TRACH BLUE RHINO 6F (TUBING) IMPLANT
INTRODUCER TRACH BLUE RHINO 8F (TUBING) IMPLANT
NS IRRIG 1000ML POUR BTL (IV SOLUTION) ×2 IMPLANT
PENCIL BUTTON HOLSTER BLD 10FT (ELECTRODE) ×2 IMPLANT
SPONGE INTESTINAL PEANUT (DISPOSABLE) ×2 IMPLANT
SUT SILK 2 0 SH CR/8 (SUTURE) ×2 IMPLANT
SUT VICRYL AB 3 0 TIES (SUTURE) ×2 IMPLANT
TOWEL OR 17X26 10 PK STRL BLUE (TOWEL DISPOSABLE) ×2 IMPLANT
TUBE CONNECTING 12X1/4 (SUCTIONS) ×2 IMPLANT
YANKAUER SUCT BULB TIP NO VENT (SUCTIONS) ×2 IMPLANT

## 2017-04-16 NOTE — Progress Notes (Signed)
PT Cancellation Note  Patient Details Name: Kylie Osborn MRN: [id number] DOB: 08/18/1996   Cancelled Treatment:    Reason Eval/Treat Not Completed: Patient at procedure or test/unavailable   Kylie Osborn 04/16/2017, 11:58 AM

## 2017-04-16 NOTE — Progress Notes (Signed)
OT Cancellation Note  Patient Details Name: Lesia HausenJada M Glockner MRN: [id number] DOB: 11/20/1996   Cancelled Treatment:    Reason Eval/Treat Not Completed: Patient at procedure or test/ unavailable.  Elliot Meldrum Ellsworthonarpe, OTR/L [phone]   Jeani HawkingConarpe, Yuta Cipollone M 04/16/2017, 11:09 AM

## 2017-04-16 NOTE — Op Note (Signed)
.  OPERATIVE REPORT  DATE OF OPERATION:  04/16/2017  PATIENT:  Kylie Osborn  21 y.o. female  PRE-OPERATIVE DIAGNOSIS:  DYSPHAGIA, ACUTE RESPIRATORY FAILURE  POST-OPERATIVE DIAGNOSIS: SAME  INDICATION(S) FOR OPERATION:  High spinal cord injury with REspiratory failure and inability to eat  FINDINGS:  Normal anatomy  PROCEDURE:  Procedure(s): PERCUTANEOUS TRACHEOSTOMY  SURGEON:  Surgeon(s): [name], Starrett, MD  ASSISTANT: Focht, PA-C  ANESTHESIA:   local and IV sedation  COMPLICATIONS:  None  EBL: 20 ml  BLOOD ADMINISTERED: none  DRAINS: Gastrostomy Tube and #6 Shiley tracheostomy tube   SPECIMEN:  No Specimen  COUNTS CORRECT:  YES  PROCEDURE DETAILS: The patient had 2 procedures performed at the bedside in the 4 N. intensive care unit. The initial procedure was a percutaneous without tracheostomy.  The patient's neck was prepped and draped in usual sterile manner without a bolster underneath her shoulder. A proper timeout was performed identifying the patient and procedure to be performed. We anesthetized the area above the sternal notch using a 25-gauge needle and Xylocaine with epinephrine. We used approximately 5-6 mL.  A transverse incision was made using a #15 blade then careful dissection down to the pretracheal fascia was done with the direct Reglan being above the level of the tracheotomy. At the second tracheal ring as the endotracheal tube been pulled back by the respiratory therapist, we passed an angiocatheter into the lumen of the trachea minute green wire through the Angiocath. This was followed by a short firm dilator increase in the size of the tracheotomy followed by the blue Rhino serial dilator. A #6 Shiley tracheostomy tube was then passed over the introducer into the tracheotomy and Kylie Osborn placed against the skin. The inner cannula was attached and then we were able to attach to the ventilator were carbon dioxide and volume return was noted.  The tracheostomy  tube was secured in place with sutures. A drain sponge was placed underneath the flanges of the tracheostomy tube. We subsequently did a bronchoscopy through the endotracheal tube and also through the tracheostomy to confirm its position. All needle counts, sponge counts, and instrument counts were correct. The patient tolerates procedure well but did require large amount of IV sedation for adequate anesthesia.  The patient was then prepped for the gastrostomy tube placement which will be documented in a separate note.  PATIENT DISPOSITION:  doone at the patient's bedside in the 4N ICU   Kylie Osborn 5/23/201811:24 AM
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
## 2017-04-16 NOTE — Progress Notes (Signed)
Trauma Service Note  Subjective: Patient's BP has been a bit low.  No acute distress.  Sleepy on the ventilator.  To have trach/PEG today  Objective: Vital signs in last 24 hours: Temp:  [98.3 F (36.8 C)-100.6 F (38.1 C)] 100 F (37.8 C) (05/23 0920) Pulse Rate:  [66-99] 80 (05/23 0920) Resp:  [12-29] 18 (05/23 0920) BP: (89-113)/(39-67) 95/39 (05/23 0920) SpO2:  [94 %-100 %] 100 % (05/23 0920) FiO2 (%):  [40 %] 40 % (05/23 0920) Weight:  [69.7 kg (153 lb 10.6 oz)] 69.7 kg (153 lb 10.6 oz) (05/23 0425)    Intake/Output from previous day: 05/22 0701 - 05/23 0700 In: 3401.7 [I.V.:1777.5; Blood:670; NG/GT:904.2; IV Piggyback:50] Out: 5950 [Urine:5950] Intake/Output this shift: Total I/O In: 523.9 [I.V.:473.9; IV Piggyback:50] Out: 475 [Urine:475]  General: Sleepy.  BP low.  Will start a bit of Neosynephrine  Lungs: Clear  Abd: Benign.  Tube feedings have been off since last night.  Extremities: No changes  Neuro: Incomplete quadriplegia.  Lab Results: CBC   Recent Labs  04/15/17 1821 04/16/17 0350  WBC 14.4* 18.5*  HGB 7.4* 9.1*  HCT 22.0* 27.4*  PLT 326 431*   BMET  Recent Labs  04/13/17 1155 04/15/17 0505  NA 137 134*  K 3.2* 3.8  CL 107 102  CO2 25 25  GLUCOSE 143* 126*  BUN 9 11  CREATININE 0.43* 0.41*  CALCIUM 7.7* 8.1*   PT/INR No results for input(s): LABPROT, INR in the last 72 hours. ABG  Recent Labs  04/15/17 0901  PHART 7.384  HCO3 29.0*    Studies/Results: Dg Chest Port 1 View  Result Date: 04/15/2017 CLINICAL DATA:  Respiratory failure EXAM: PORTABLE CHEST 1 VIEW COMPARISON:  Two days ago FINDINGS: Endotracheal tube tip at the clavicular heads. An orogastric tube at least reaches the stomach which contains gas and fluid. Right upper extremity PICC with tip at the upper right atrium. Significantly improved aeration at the bases, especially on the right. There is still likely pneumonitis or atelectasis. Normal heart size. No  visible pneumothorax. IMPRESSION: 1. Stable positioning of tubes and central line. 2. Significantly improved aeration. Electronically Signed   By: Marnee SpringJonathon  Watts M.D.   On: 04/15/2017 07:39    Anti-infectives: Anti-infectives    Start     Dose/Rate Route Frequency Ordered Stop   04/12/17 1000  fluconazole (DIFLUCAN) IVPB 200 mg     200 mg 100 mL/hr over 60 Minutes Intravenous Every 24 hours 04/12/17 0934 04/14/17 1044   04/10/17 0900  ceFEPIme (MAXIPIME) 1 g in dextrose 5 % 50 mL IVPB     1 g 100 mL/hr over 30 Minutes Intravenous Every 8 hours 04/10/17 0806     04/08/17 1515  ceFAZolin (ANCEF) IVPB 2 g/50 mL premix     2 g 100 mL/hr over 30 Minutes Intravenous  Once 04/08/17 1503 04/08/17 1430   04/08/17 1000  ceFAZolin (ANCEF) IVPB 2g/100 mL premix  Status:  Discontinued     2 g 200 mL/hr over 30 Minutes Intravenous On call to O.R. 04/06/17 1117 04/06/17 2110   04/06/17 2001  vancomycin (VANCOCIN) powder  Status:  Discontinued       As needed 04/06/17 2006 04/06/17 2045   04/06/17 1720  bacitracin 50,000 Units in sodium chloride irrigation 0.9 % 500 mL irrigation  Status:  Discontinued       As needed 04/06/17 1904 04/06/17 2045   04/06/17 1117  ceFAZolin (ANCEF) IVPB 2g/100 mL premix  Status:  Discontinued     2 g 200 mL/hr over 30 Minutes Intravenous 30 min pre-op 04/06/17 1117 04/06/17 2110   04/06/17 0845  ceFAZolin (ANCEF) IVPB 2g/100 mL premix     2 g 200 mL/hr over 30 Minutes Intravenous  Once 04/06/17 0832 04/06/17 1059      Assessment/Plan: s/p Procedure(s): OPEN REDUCTION INTERNAL FIXATION (ORIF) ULNAR FRACTURE and RADIAL FRACTURE SCALP LACERATION REPAIR For trach and PEG today.  WBC increasing but all cultures are negative.  On empiric antibiotics.  Will continue to look for a source of infection.  LOS: 10 days   Marta Lamas. Gae Bon, MD, FACS [phone] Trauma Surgeon 04/16/2017

## 2017-04-16 NOTE — Op Note (Signed)
[facility] Patient Name: Kylie Osborn Procedure Date : 04/16/2017 MRN: [id number] Attending MD: Kylie Ruddy, MD Date of Birth: 13-Apr-1996 CSN: [id number] Age: 21 Admit Type: Inpatient Procedure:                Upper GI endoscopy Indications:              Place PEG because patient is unable to eat, Place                            PEG due to dysphagia, Place PEG due to neurological                            disorder causing impaired swallowing Providers:                Kylie Ruddy, MD, Oletha Blend, Technician,                            Margo Aye, Technician Referring MD:              Medicines:                Fentanyl 100 micrograms IV, Midazolam 4 mg IV,                            norcuron 10 mg IV Complications:            No immediate complications. Estimated Blood Loss:      Procedure:                Pre-Anesthesia Assessment:                           - Prior to the procedure, a History and Physical                            was performed, and patient medications and                            allergies were reviewed. The patient is competent.                            The risks and benefits of the procedure and the                            sedation options and risks were discussed with the                            patient. All questions were answered and informed                            consent was obtained. Patient identification and                            proposed procedure were verified by the physician  and the nurse Patient's Room. Mental Status                            Examination: alert and oriented. Airway                            Examination: tracheostomy via ventilator.                            Respiratory Examination: clear to auscultation. CV                            Examination: normal. ASA Grade Assessment: I - A                            normal, healthy patient. After reviewing the risks                             and benefits, the patient was deemed in                            satisfactory condition to undergo the procedure.                            The anesthesia plan was to use moderate sedation /                            analgesia (conscious sedation). Immediately prior                            to administration of medications, the patient was                            re-assessed for adequacy to receive sedatives. The                            heart rate, respiratory rate, oxygen saturations,                            blood pressure, adequacy of pulmonary ventilation,                            and response to care were monitored throughout the                            procedure. The physical status of the patient was                            re-assessed after the procedure.                           After obtaining informed consent, the endoscope was  passed under direct vision. Throughout the                            procedure, the patient's blood pressure, pulse, and                            oxygen saturations were monitored continuously. The                            EG-[id number] ([id number]) scope was introduced through the                            mouth, and advanced to the second part of duodenum.                            The upper GI endoscopy was accomplished with ease.                            The patient tolerated the procedure well. Scope In: Scope Out: Findings:      The upper third of the esophagus was normal.      The entire examined stomach was normal. The patient was placed in the       supine position for PEG placement. The stomach was insufflated to appose       gastric and abdominal walls. A site was located in the body of the       stomach with excellent transillumination and manual external pressure       for placement. The abdominal wall was marked and prepped in a sterile       manner. The area was  anesthetized with 4 mL of 0.5% lidocaine. The       trocar needle was introduced through the abdominal wall and into the       stomach under direct endoscopic view. A snare was introduced through the       endoscope and opened in the gastric lumen. The guide wire was passed       through the trocar and into the open snare. The snare was closed around       the guide wire. The endoscope and snare were removed, pulling the wire       out through the mouth. A skin incision was made at the site of needle       insertion. The externally removable 24 Fr Bard gastrostomy tube was       lubricated. The G-tube was tied to the guide wire and pulled through the       mouth and into the stomach. The trocar needle was removed, and the       gastrostomy tube was pulled out from the stomach through the skin. The       external bumper was attached to the gastrostomy tube, and the tube was       cut to remove the guide wire. The final position of the gastrostomy tube       was confirmed by relook endoscopy, and skin marking noted to be 3.5 cm       at the external bumper. The final tension and compression of the       abdominal wall by the PEG tube and external  bumper were checked and       revealed that the PEG balloon was moderately tight and mildly       compressing the stomach. The feeding tube was capped, and the tube site       cleaned and dressed.      The second portion of the duodenum was normal. Impression:               - Normal upper third of esophagus.                           - Normal stomach.                           - Normal second portion of the duodenum.                           - An externally removable PEG placement was                            successfully completed.                           - No specimens collected.                           - Normal examination. Recommendation:           - Please follow the post-PEG recommendations                            including: start  using PEG today. Procedure Code(s):        --- Professional ---                           [phone], Esophagogastroduodenoscopy, flexible,                            transoral; with directed placement of percutaneous                            gastrostomy tube Diagnosis Code(s):        --- Professional ---                           R63.3, Feeding difficulties                           Z43.1, Encounter for attention to gastrostomy                           R13.10, Dysphagia, unspecified                           R29.818, Other symptoms and signs involving the                            nervous system CPT copyright 2016 American Medical Association. All rights reserved. The codes documented in this report are preliminary and upon  coder review may  be revised to meet current compliance requirements. Kylie RuddyJames Isa Hitz III, MD Kylie NormanJames Jeffry Vogelsang III, MD 04/16/2017 2:25:59 PM This report has been signed electronically. Number of Addenda: 0

## 2017-04-17 ENCOUNTER — Encounter (HOSPITAL_COMMUNITY): Payer: Self-pay | Admitting: General Surgery

## 2017-04-17 LAB — CBC WITH DIFFERENTIAL/PLATELET
BASOS ABS: 0 10*3/uL (ref 0.0–0.1)
Basophils Relative: 0 %
Eosinophils Absolute: 0.2 10*3/uL (ref 0.0–0.7)
Eosinophils Relative: 1 %
HEMATOCRIT: 30.1 % — AB (ref 36.0–46.0)
Hemoglobin: 9.6 g/dL — ABNORMAL LOW (ref 12.0–15.0)
LYMPHS ABS: 2 10*3/uL (ref 0.7–4.0)
Lymphocytes Relative: 13 %
MCH: 30.1 pg (ref 26.0–34.0)
MCHC: 31.9 g/dL (ref 30.0–36.0)
MCV: 94.4 fL (ref 78.0–100.0)
MONO ABS: 1.7 10*3/uL — AB (ref 0.1–1.0)
MONOS PCT: 11 %
NEUTROS PCT: 75 %
Neutro Abs: 11.8 10*3/uL — ABNORMAL HIGH (ref 1.7–7.7)
Platelets: 508 10*3/uL — ABNORMAL HIGH (ref 150–400)
RBC: 3.19 MIL/uL — ABNORMAL LOW (ref 3.87–5.11)
RDW: 16.7 % — ABNORMAL HIGH (ref 11.5–15.5)
WBC: 15.7 10*3/uL — AB (ref 4.0–10.5)

## 2017-04-17 LAB — GLUCOSE, CAPILLARY
GLUCOSE-CAPILLARY: 101 mg/dL — AB (ref 65–99)
Glucose-Capillary: 141 mg/dL — ABNORMAL HIGH (ref 65–99)
Glucose-Capillary: 116 mg/dL — ABNORMAL HIGH (ref 65–99)
Glucose-Capillary: 104 mg/dL — ABNORMAL HIGH (ref 65–99)
Glucose-Capillary: 119 mg/dL — ABNORMAL HIGH (ref 65–99)
Glucose-Capillary: 116 mg/dL — ABNORMAL HIGH (ref 65–99)

## 2017-04-17 MED ORDER — CHLORHEXIDINE GLUCONATE CLOTH 2 % EX PADS
6.0000 | MEDICATED_PAD | Freq: Every day | CUTANEOUS | Status: DC
  Administered 2017-04-17: 6 via TOPICAL

## 2017-04-17 MED ORDER — ONDANSETRON HCL 4 MG/2ML IJ SOLN
4.0000 mg | Freq: Four times a day (QID) | INTRAMUSCULAR | Status: DC | PRN
  Administered 2017-04-17: 4 mg via INTRAVENOUS
  Filled 2017-04-17: qty 2
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
## 2017-04-17 NOTE — Progress Notes (Addendum)
No acute events Trach yesterday Left lower extremity function unchanged, right leg shows trace activation in quads Continue care Ok to transfer to rehab from my perspective No acute neurosurgical issues; will sign off.  Feel free to call with questions.

## 2017-04-17 NOTE — Progress Notes (Signed)
Pt was manually ventilated 100% bagged lavage with 3cc of normal saline due to patient desaturation in the upper 80's. Pt tolerated procedure well w/o any apparent complications noted. Bag ventilations were easy to ventilate with visual chest rise. Once suctioned Got back copious amounts of pink tinged blood secretions. X2 passes tolerated it well. RN at bedside throughout procedure. Pt is stable at this time. And SATs are now acceptable.
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
## 2017-04-17 NOTE — Progress Notes (Signed)
Spoke with RN Victorino DikeJennifer from [facility]. Report was given. RN requested that patient's tube feed be stopped 2 hours prior to their arrival. Patient will need to have enough Fentanyl, Versed, and Neo-Synephrine with her for the 2 hours of travel (flight and ground together). RN will call and update us with their planned time of arrival. Keanthony Poole, [place] ScrapeSarah E, RN

## 2017-04-17 NOTE — Progress Notes (Signed)
[facility] scheduled to pick pt up at 8:30am.  They will call pt's mother with flight information.    Quintella BatonJulie W. Charle Mclaurin, RN, BSN  Trauma/Neuro ICU Case Manager [phone]

## 2017-04-17 NOTE — Progress Notes (Signed)
Sutures removed and new dressings applied to pt's hand and forearm. Pt fitted for a splint. Kylie Osborn, [place] ScrapeSarah E, RN
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
## 2017-04-17 NOTE — Progress Notes (Signed)
Trach care done RN assisted due to cervical neck collar being on. Pt tolerated it well. No complications noted.

## 2017-04-17 NOTE — Progress Notes (Signed)
Radiologist called to say they would have patient's records in disc form ready by 0730. Day RN will need to go down to pick them up before patient leaves at 0830. Yaretsi Humphres, [place] ScrapeSarah E, RN

## 2017-04-17 NOTE — Progress Notes (Signed)
Trach care done per RRT. Front of the Cervical Collar was removed per RN to do trach care. Site was soiled with bloody secretions. New clean dressing applied. New IC inserted and locked back into place without complications. C-Collar placed back without complications. Pt is stable at this time no distress noted.

## 2017-04-18 ENCOUNTER — Inpatient Hospital Stay (HOSPITAL_COMMUNITY): Payer: Commercial Managed Care - HMO

## 2017-04-18 ENCOUNTER — Encounter (HOSPITAL_COMMUNITY): Payer: Self-pay | Admitting: General Surgery

## 2017-04-18 DIAGNOSIS — S12200D Unspecified displaced fracture of third cervical vertebra, subsequent encounter for fracture with routine healing: Secondary | ICD-10-CM | POA: Diagnosis not present

## 2017-04-18 DIAGNOSIS — D72829 Elevated white blood cell count, unspecified: Secondary | ICD-10-CM | POA: Diagnosis not present

## 2017-04-18 DIAGNOSIS — S62317D Displaced fracture of base of fifth metacarpal bone. left hand, subsequent encounter for fracture with routine healing: Secondary | ICD-10-CM | POA: Diagnosis not present

## 2017-04-18 DIAGNOSIS — I739 Peripheral vascular disease, unspecified: Secondary | ICD-10-CM | POA: Diagnosis not present

## 2017-04-18 DIAGNOSIS — J939 Pneumothorax, unspecified: Secondary | ICD-10-CM | POA: Diagnosis not present

## 2017-04-18 DIAGNOSIS — S62305D Unspecified fracture of fourth metacarpal bone, left hand, subsequent encounter for fracture with routine healing: Secondary | ICD-10-CM | POA: Diagnosis not present

## 2017-04-18 DIAGNOSIS — I809 Phlebitis and thrombophlebitis of unspecified site: Secondary | ICD-10-CM | POA: Diagnosis not present

## 2017-04-18 DIAGNOSIS — S14154A Other incomplete lesion at C4 level of cervical spinal cord, initial encounter: Secondary | ICD-10-CM | POA: Diagnosis not present

## 2017-04-18 DIAGNOSIS — S52302E Unspecified fracture of shaft of left radius, subsequent encounter for open fracture type I or II with routine healing: Secondary | ICD-10-CM | POA: Diagnosis not present

## 2017-04-18 DIAGNOSIS — I9589 Other hypotension: Secondary | ICD-10-CM | POA: Diagnosis not present

## 2017-04-18 DIAGNOSIS — S12201A Unspecified nondisplaced fracture of third cervical vertebra, initial encounter for closed fracture: Secondary | ICD-10-CM | POA: Diagnosis not present

## 2017-04-18 DIAGNOSIS — G8250 Quadriplegia, unspecified: Secondary | ICD-10-CM | POA: Diagnosis not present

## 2017-04-18 DIAGNOSIS — Z9911 Dependence on respirator [ventilator] status: Secondary | ICD-10-CM | POA: Diagnosis not present

## 2017-04-18 DIAGNOSIS — S52202D Unspecified fracture of shaft of left ulna, subsequent encounter for closed fracture with routine healing: Secondary | ICD-10-CM | POA: Diagnosis not present

## 2017-04-18 DIAGNOSIS — G8911 Acute pain due to trauma: Secondary | ICD-10-CM | POA: Diagnosis not present

## 2017-04-18 DIAGNOSIS — Z136 Encounter for screening for cardiovascular disorders: Secondary | ICD-10-CM | POA: Diagnosis not present

## 2017-04-18 DIAGNOSIS — M542 Cervicalgia: Secondary | ICD-10-CM | POA: Diagnosis not present

## 2017-04-18 DIAGNOSIS — Z981 Arthrodesis status: Secondary | ICD-10-CM | POA: Diagnosis not present

## 2017-04-18 DIAGNOSIS — S52032A Displaced fracture of olecranon process with intraarticular extension of left ulna, initial encounter for closed fracture: Secondary | ICD-10-CM | POA: Diagnosis not present

## 2017-04-18 DIAGNOSIS — R093 Abnormal sputum: Secondary | ICD-10-CM | POA: Diagnosis not present

## 2017-04-18 DIAGNOSIS — J9690 Respiratory failure, unspecified, unspecified whether with hypoxia or hypercapnia: Secondary | ICD-10-CM | POA: Diagnosis not present

## 2017-04-18 DIAGNOSIS — J9602 Acute respiratory failure with hypercapnia: Secondary | ICD-10-CM | POA: Diagnosis not present

## 2017-04-18 DIAGNOSIS — S27321A Contusion of lung, unilateral, initial encounter: Secondary | ICD-10-CM | POA: Diagnosis not present

## 2017-04-18 DIAGNOSIS — S129XXD Fracture of neck, unspecified, subsequent encounter: Secondary | ICD-10-CM | POA: Diagnosis not present

## 2017-04-18 DIAGNOSIS — S62301D Unspecified fracture of second metacarpal bone, left hand, subsequent encounter for fracture with routine healing: Secondary | ICD-10-CM | POA: Diagnosis not present

## 2017-04-18 DIAGNOSIS — E611 Iron deficiency: Secondary | ICD-10-CM | POA: Diagnosis not present

## 2017-04-18 DIAGNOSIS — J189 Pneumonia, unspecified organism: Secondary | ICD-10-CM | POA: Diagnosis not present

## 2017-04-18 DIAGNOSIS — J9600 Acute respiratory failure, unspecified whether with hypoxia or hypercapnia: Secondary | ICD-10-CM | POA: Diagnosis not present

## 2017-04-18 DIAGNOSIS — R140 Abdominal distension (gaseous): Secondary | ICD-10-CM | POA: Diagnosis not present

## 2017-04-18 DIAGNOSIS — G8252 Quadriplegia, C1-C4 incomplete: Secondary | ICD-10-CM | POA: Diagnosis not present

## 2017-04-18 DIAGNOSIS — S2242XA Multiple fractures of ribs, left side, initial encounter for closed fracture: Secondary | ICD-10-CM | POA: Diagnosis not present

## 2017-04-18 LAB — CBC
HCT: 27.9 % — ABNORMAL LOW (ref 36.0–46.0)
Hemoglobin: 8.7 g/dL — ABNORMAL LOW (ref 12.0–15.0)
MCH: 29.7 pg (ref 26.0–34.0)
MCHC: 31.2 g/dL (ref 30.0–36.0)
MCV: 95.2 fL (ref 78.0–100.0)
PLATELETS: 498 10*3/uL — AB (ref 150–400)
RBC: 2.93 MIL/uL — AB (ref 3.87–5.11)
RDW: 16 % — ABNORMAL HIGH (ref 11.5–15.5)
WBC: 14.3 10*3/uL — ABNORMAL HIGH (ref 4.0–10.5)

## 2017-04-18 LAB — BASIC METABOLIC PANEL
Anion gap: 9 (ref 5–15)
BUN: 16 mg/dL (ref 6–20)
CO2: 24 mmol/L (ref 22–32)
CREATININE: 0.52 mg/dL (ref 0.44–1.00)
Calcium: 8.8 mg/dL — ABNORMAL LOW (ref 8.9–10.3)
Chloride: 102 mmol/L (ref 101–111)
Glucose, Bld: 143 mg/dL — ABNORMAL HIGH (ref 65–99)
Potassium: 3.7 mmol/L (ref 3.5–5.1)
SODIUM: 135 mmol/L (ref 135–145)

## 2017-04-18 LAB — GLUCOSE, CAPILLARY
GLUCOSE-CAPILLARY: 125 mg/dL — AB (ref 65–99)
GLUCOSE-CAPILLARY: 122 mg/dL — AB (ref 65–99)

## 2017-04-18 MED ORDER — PIVOT 1.5 CAL PO LIQD: 1000.0000 mL | ORAL | 0 refills | Status: DC

## 2017-04-18 MED ORDER — ENOXAPARIN SODIUM 40 MG/0.4ML ~~LOC~~ SOLN: 40.0000 mg | Freq: Every day | SUBCUTANEOUS | Status: DC

## 2017-04-18 MED ORDER — CHLORHEXIDINE GLUCONATE CLOTH 2 % EX PADS: 6.0000 | MEDICATED_PAD | Freq: Every day | CUTANEOUS | Status: DC

## 2017-04-18 MED ORDER — FENTANYL 2500MCG IN NS 250ML (10MCG/ML) PREMIX INFUSION: 25.0000 ug/h | INTRAVENOUS | Status: DC

## 2017-04-18 MED ORDER — ASPIRIN 325 MG PO TABS: 325.0000 mg | ORAL_TABLET | Freq: Every day | ORAL | Status: DC

## 2017-04-18 MED ORDER — BENZOCAINE (TOPICAL) 20 % EX AERO: 1.0000 "application " | INHALATION_SPRAY | Freq: Four times a day (QID) | CUTANEOUS | 0 refills | Status: DC | PRN

## 2017-04-18 MED ORDER — SODIUM CHLORIDE 0.9 % IV SOLN: 1.0000 mg/h | INTRAVENOUS | Status: DC

## 2017-04-18 MED ORDER — SODIUM CHLORIDE 0.9 % IV SOLN: 50.0000 mL | INTRAVENOUS | 0 refills | Status: DC

## 2017-04-18 MED ORDER — ESCITALOPRAM OXALATE 5 MG PO TABS: 5.0000 mg | ORAL_TABLET | Freq: Every day | ORAL | Status: DC

## 2017-04-18 MED ORDER — FENTANYL BOLUS VIA INFUSION
50.0000 ug | INTRAVENOUS | 0 refills | Status: DC | PRN
Start: 2017-04-18 — End: 2017-07-21

## 2017-04-18 MED ORDER — ONDANSETRON HCL 4 MG/2ML IJ SOLN: 4.0000 mg | Freq: Four times a day (QID) | INTRAMUSCULAR | 0 refills | Status: DC | PRN

## 2017-04-18 MED ORDER — FENTANYL CITRATE (PF) 100 MCG/2ML IJ SOLN: 25.0000 ug | INTRAMUSCULAR | 0 refills | Status: DC | PRN

## 2017-04-18 MED ORDER — CHLORHEXIDINE GLUCONATE 0.12% ORAL RINSE (MEDLINE KIT): 15.0000 mL | Freq: Two times a day (BID) | OROMUCOSAL | 0 refills | Status: DC

## 2017-04-18 MED ORDER — POLYETHYLENE GLYCOL 3350 17 G PO PACK: 17.0000 g | PACK | Freq: Every day | ORAL | 0 refills | Status: DC | PRN

## 2017-04-18 MED ORDER — SENNOSIDES 8.8 MG/5ML PO SYRP: 5.0000 mL | ORAL_SOLUTION | Freq: Two times a day (BID) | ORAL | 0 refills | Status: DC | PRN

## 2017-04-18 MED ORDER — PANTOPRAZOLE SODIUM 40 MG PO TBEC: 40.0000 mg | DELAYED_RELEASE_TABLET | Freq: Every day | ORAL | Status: DC

## 2017-04-18 MED ORDER — SODIUM CHLORIDE 0.9% FLUSH: 10.0000 mL | INTRAVENOUS | Status: DC | PRN

## 2017-04-18 MED ORDER — SODIUM CHLORIDE 0.9% FLUSH: 10.0000 mL | Freq: Two times a day (BID) | INTRAVENOUS | Status: DC

## 2017-04-18 MED ORDER — CHLORHEXIDINE GLUCONATE 0.12 % MT SOLN
OROMUCOSAL | Status: AC
Start: 2017-04-18 — End: 2017-04-18
  Filled 2017-04-18: qty 15

## 2017-04-18 MED ORDER — BACITRACIN ZINC 500 UNIT/GM EX OINT: TOPICAL_OINTMENT | Freq: Two times a day (BID) | CUTANEOUS | 0 refills | Status: DC

## 2017-04-18 MED ORDER — ORAL CARE MOUTH RINSE: 15.0000 mL | OROMUCOSAL | 0 refills | Status: DC

## 2017-04-18 MED ORDER — LORAZEPAM 2 MG/ML IJ SOLN: 0.5000 mg | INTRAMUSCULAR | 0 refills | Status: DC | PRN

## 2017-04-18 MED ORDER — ACETAMINOPHEN 325 MG PO TABS: 650.0000 mg | ORAL_TABLET | Freq: Four times a day (QID) | ORAL | Status: DC | PRN

## 2017-04-18 MED ORDER — IPRATROPIUM-ALBUTEROL 0.5-2.5 (3) MG/3ML IN SOLN: 3.0000 mL | RESPIRATORY_TRACT | Status: DC | PRN
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
## 2017-04-18 NOTE — Progress Notes (Signed)
I was able to say goodbye to the patient and her family.  She is stable.  No attempts to wean off the ventilator.  No other changes for now except we will discontinue her Maxipime.  Marta LamasJames O. Gae BonWyatt, III, MD, FACS [phone] Trauma Surgeon

## 2017-04-18 NOTE — Progress Notes (Signed)
   Subjective/Chief Complaint: 21 y/o F POD9 s/p debridement and closure of left scalp laceration. Patient planned for transport to rehab.   Objective: General: trached/sedated HEENT: scalp wound clean/dry/intact. No obvious signs of hematoma formation. No signs of infection. Staples/sutures removed.  Assessment: S/p left scalp laceration repair.   **RECOMMENDATIONS: 1. Continue to clean wound with 1:3 parts hydrogen peroxide/saline twice daily. Coat with layer of bacitracin. 2. Starting May 30th, discontinue use of bacitracin and apply Mederma scar gel daily after cleaning wound. Use for next 8 wks.  Vivia EwingJustin Icyss Skog, OhioDMD [phone] 04/09/2017

## 2017-04-18 NOTE — Progress Notes (Signed)
[facility].  Report given to Ardine Bjorkay Lane, RN.  Report was also [facility].  Pt stable.  Family here with patient and transporting with her.  All needs addressed.  Wasted 40cc of Fentanyl gtt with Lawrence SantiagoMarlienne Goldin, RN.  Per pharmacy, old fentanyl gtt was wasted and RN scanned a new bag of Fentanyl to send with pt for her to have en route.

## 2017-04-20 DIAGNOSIS — M542 Cervicalgia: Secondary | ICD-10-CM | POA: Diagnosis not present
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
## 2017-05-01 DIAGNOSIS — S52325A Nondisplaced transverse fracture of shaft of left radius, initial encounter for closed fracture: Secondary | ICD-10-CM | POA: Diagnosis not present

## 2017-05-01 DIAGNOSIS — S52202D Unspecified fracture of shaft of left ulna, subsequent encounter for closed fracture with routine healing: Secondary | ICD-10-CM | POA: Diagnosis not present

## 2017-05-01 DIAGNOSIS — Z9911 Dependence on respirator [ventilator] status: Secondary | ICD-10-CM | POA: Diagnosis not present

## 2017-05-01 DIAGNOSIS — G8252 Quadriplegia, C1-C4 incomplete: Secondary | ICD-10-CM | POA: Diagnosis not present

## 2017-05-01 DIAGNOSIS — S62616A Displaced fracture of proximal phalanx of right little finger, initial encounter for closed fracture: Secondary | ICD-10-CM | POA: Diagnosis not present

## 2017-05-01 DIAGNOSIS — S14154A Other incomplete lesion at C4 level of cervical spinal cord, initial encounter: Secondary | ICD-10-CM | POA: Diagnosis not present

## 2017-05-01 DIAGNOSIS — S52225A Nondisplaced transverse fracture of shaft of left ulna, initial encounter for closed fracture: Secondary | ICD-10-CM | POA: Diagnosis not present

## 2017-05-01 DIAGNOSIS — R093 Abnormal sputum: Secondary | ICD-10-CM | POA: Diagnosis not present

## 2017-05-01 DIAGNOSIS — S62646A Nondisplaced fracture of proximal phalanx of right little finger, initial encounter for closed fracture: Secondary | ICD-10-CM | POA: Diagnosis not present

## 2017-05-01 DIAGNOSIS — S63055A Dislocation of other carpometacarpal joint of left hand, initial encounter: Secondary | ICD-10-CM | POA: Diagnosis not present

## 2017-05-01 DIAGNOSIS — S66323A Laceration of extensor muscle, fascia and tendon of left middle finger at wrist and hand level, initial encounter: Secondary | ICD-10-CM | POA: Diagnosis not present

## 2017-05-01 DIAGNOSIS — R140 Abdominal distension (gaseous): Secondary | ICD-10-CM | POA: Diagnosis not present

## 2017-05-01 DIAGNOSIS — G8250 Quadriplegia, unspecified: Secondary | ICD-10-CM | POA: Diagnosis not present

## 2017-05-01 DIAGNOSIS — M4322 Fusion of spine, cervical region: Secondary | ICD-10-CM | POA: Diagnosis not present

## 2017-05-01 DIAGNOSIS — S62347A Nondisplaced fracture of base of fifth metacarpal bone. left hand, initial encounter for closed fracture: Secondary | ICD-10-CM | POA: Diagnosis not present

## 2017-05-01 DIAGNOSIS — S12200D Unspecified displaced fracture of third cervical vertebra, subsequent encounter for fracture with routine healing: Secondary | ICD-10-CM | POA: Diagnosis not present

## 2017-05-01 DIAGNOSIS — Z4789 Encounter for other orthopedic aftercare: Secondary | ICD-10-CM | POA: Diagnosis not present

## 2017-05-01 DIAGNOSIS — E611 Iron deficiency: Secondary | ICD-10-CM | POA: Diagnosis not present

## 2017-05-01 DIAGNOSIS — I6509 Occlusion and stenosis of unspecified vertebral artery: Secondary | ICD-10-CM | POA: Diagnosis not present

## 2017-05-01 DIAGNOSIS — S61421A Laceration with foreign body of right hand, initial encounter: Secondary | ICD-10-CM | POA: Diagnosis not present

## 2017-05-01 DIAGNOSIS — S52032A Displaced fracture of olecranon process with intraarticular extension of left ulna, initial encounter for closed fracture: Secondary | ICD-10-CM | POA: Diagnosis not present

## 2017-05-01 DIAGNOSIS — J952 Acute pulmonary insufficiency following nonthoracic surgery: Secondary | ICD-10-CM | POA: Diagnosis not present

## 2017-05-01 DIAGNOSIS — Z472 Encounter for removal of internal fixation device: Secondary | ICD-10-CM | POA: Diagnosis not present

## 2017-05-01 DIAGNOSIS — S52302D Unspecified fracture of shaft of left radius, subsequent encounter for closed fracture with routine healing: Secondary | ICD-10-CM | POA: Diagnosis not present

## 2017-05-01 DIAGNOSIS — S52202A Unspecified fracture of shaft of left ulna, initial encounter for closed fracture: Secondary | ICD-10-CM | POA: Diagnosis not present

## 2017-05-01 DIAGNOSIS — J9602 Acute respiratory failure with hypercapnia: Secondary | ICD-10-CM | POA: Diagnosis not present

## 2017-05-08 DIAGNOSIS — S61421A Laceration with foreign body of right hand, initial encounter: Secondary | ICD-10-CM | POA: Diagnosis not present

## 2017-05-08 DIAGNOSIS — S66323A Laceration of extensor muscle, fascia and tendon of left middle finger at wrist and hand level, initial encounter: Secondary | ICD-10-CM | POA: Diagnosis not present

## 2017-05-08 DIAGNOSIS — Z472 Encounter for removal of internal fixation device: Secondary | ICD-10-CM | POA: Diagnosis not present

## 2017-05-08 DIAGNOSIS — S62347A Nondisplaced fracture of base of fifth metacarpal bone. left hand, initial encounter for closed fracture: Secondary | ICD-10-CM | POA: Diagnosis not present

## 2017-05-08 DIAGNOSIS — S63055A Dislocation of other carpometacarpal joint of left hand, initial encounter: Secondary | ICD-10-CM | POA: Diagnosis not present

## 2017-05-14 ENCOUNTER — Other Ambulatory Visit: Payer: Self-pay | Admitting: Family Medicine

## 2017-06-13 DIAGNOSIS — Z5189 Encounter for other specified aftercare: Secondary | ICD-10-CM | POA: Diagnosis not present

## 2017-06-13 DIAGNOSIS — N319 Neuromuscular dysfunction of bladder, unspecified: Secondary | ICD-10-CM | POA: Diagnosis not present

## 2017-06-13 DIAGNOSIS — M62838 Other muscle spasm: Secondary | ICD-10-CM | POA: Diagnosis not present

## 2017-06-16 DIAGNOSIS — G8250 Quadriplegia, unspecified: Secondary | ICD-10-CM | POA: Diagnosis not present

## 2017-06-18 DIAGNOSIS — S61421D Laceration with foreign body of right hand, subsequent encounter: Secondary | ICD-10-CM | POA: Diagnosis not present

## 2017-06-19 DIAGNOSIS — G8250 Quadriplegia, unspecified: Secondary | ICD-10-CM | POA: Diagnosis not present

## 2017-06-25 DIAGNOSIS — G8250 Quadriplegia, unspecified: Secondary | ICD-10-CM | POA: Diagnosis not present

## 2017-06-25 DIAGNOSIS — Z472 Encounter for removal of internal fixation device: Secondary | ICD-10-CM | POA: Diagnosis not present

## 2017-06-26 DIAGNOSIS — G8250 Quadriplegia, unspecified: Secondary | ICD-10-CM | POA: Diagnosis not present

## 2017-07-02 DIAGNOSIS — G8250 Quadriplegia, unspecified: Secondary | ICD-10-CM | POA: Diagnosis not present

## 2017-07-03 DIAGNOSIS — G8250 Quadriplegia, unspecified: Secondary | ICD-10-CM | POA: Diagnosis not present

## 2017-07-10 DIAGNOSIS — G8250 Quadriplegia, unspecified: Secondary | ICD-10-CM | POA: Diagnosis not present

## 2017-07-10 DIAGNOSIS — M21371 Foot drop, right foot: Secondary | ICD-10-CM | POA: Diagnosis not present

## 2017-07-16 DIAGNOSIS — G8250 Quadriplegia, unspecified: Secondary | ICD-10-CM | POA: Diagnosis not present

## 2017-07-17 DIAGNOSIS — G8250 Quadriplegia, unspecified: Secondary | ICD-10-CM | POA: Diagnosis not present

## 2017-07-21 ENCOUNTER — Ambulatory Visit (INDEPENDENT_AMBULATORY_CARE_PROVIDER_SITE_OTHER): Payer: 59 | Admitting: Family Medicine

## 2017-07-21 ENCOUNTER — Other Ambulatory Visit (HOSPITAL_COMMUNITY)
Admission: RE | Admit: 2017-07-21 | Discharge: 2017-07-21 | Disposition: A | Discharge status: Home / Self Care | Payer: 59 | Source: Ambulatory Visit | Attending: Family Medicine | Admitting: Family Medicine

## 2017-07-21 ENCOUNTER — Encounter: Payer: Self-pay | Admitting: Family Medicine

## 2017-07-21 VITALS — BP 100/68 | HR 74 | Temp 98.8°F | Resp 16 | Ht 67.0 in | Wt 133.0 lb

## 2017-07-21 DIAGNOSIS — R82998 Other abnormal findings in urine: Secondary | ICD-10-CM

## 2017-07-21 DIAGNOSIS — S069X9A Unspecified intracranial injury with loss of consciousness of unspecified duration, initial encounter: Secondary | ICD-10-CM | POA: Insufficient documentation

## 2017-07-21 DIAGNOSIS — R8299 Other abnormal findings in urine: Secondary | ICD-10-CM

## 2017-07-21 DIAGNOSIS — S069X0S Unspecified intracranial injury without loss of consciousness, sequela: Secondary | ICD-10-CM

## 2017-07-21 DIAGNOSIS — N898 Other specified noninflammatory disorders of vagina: Secondary | ICD-10-CM | POA: Diagnosis present

## 2017-07-21 DIAGNOSIS — S069XAA Unspecified intracranial injury with loss of consciousness status unknown, initial encounter: Secondary | ICD-10-CM | POA: Insufficient documentation

## 2017-07-21 LAB — POCT URINALYSIS DIPSTICK
BILIRUBIN UA: NEGATIVE
Blood, UA: NEGATIVE
GLUCOSE UA: NEGATIVE
SPEC GRAV UA: 1.01 (ref 1.010–1.025)
UROBILINOGEN UA: 0.2 U/dL
pH, UA: 7 (ref 5.0–8.0)

## 2017-07-21 MED ORDER — VENLAFAXINE HCL 50 MG PO TABS: 50.0000 mg | ORAL_TABLET | Freq: Two times a day (BID) | ORAL | 6 refills | Status: DC

## 2017-07-21 NOTE — Progress Notes (Signed)
Pre visit review using our clinic review tool, if applicable. No additional management support is needed unless otherwise documented below in the visit note. 
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
## 2017-07-21 NOTE — Patient Instructions (Signed)
Schedule your complete physical in 3-4 months We'll notify you of your urine results and treat if needed We'll call you with the neuro appt to review possibly stopping or adjusting the Lithium Restart the Effexor twice daily Call with any questions or concerns You are a miracle!!!
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
## 2017-07-24 ENCOUNTER — Ambulatory Visit: Payer: 59 | Admitting: Speech Pathology

## 2017-07-24 ENCOUNTER — Encounter: Payer: Self-pay | Admitting: Occupational Therapy

## 2017-07-24 ENCOUNTER — Ambulatory Visit: Payer: 59 | Attending: Physical Medicine and Rehabilitation | Admitting: Physical Therapy

## 2017-07-24 ENCOUNTER — Other Ambulatory Visit: Payer: Self-pay | Admitting: General Practice

## 2017-07-24 ENCOUNTER — Ambulatory Visit: Payer: 59 | Attending: Physical Medicine and Rehabilitation | Admitting: Occupational Therapy

## 2017-07-24 ENCOUNTER — Encounter: Payer: Self-pay | Admitting: Physical Therapy

## 2017-07-24 ENCOUNTER — Other Ambulatory Visit: Payer: Self-pay | Admitting: Family Medicine

## 2017-07-24 DIAGNOSIS — R498 Other voice and resonance disorders: Secondary | ICD-10-CM | POA: Insufficient documentation

## 2017-07-24 DIAGNOSIS — Z79899 Other long term (current) drug therapy: Secondary | ICD-10-CM

## 2017-07-24 DIAGNOSIS — R293 Abnormal posture: Secondary | ICD-10-CM | POA: Diagnosis present

## 2017-07-24 DIAGNOSIS — R41841 Cognitive communication deficit: Secondary | ICD-10-CM | POA: Diagnosis present

## 2017-07-24 DIAGNOSIS — M6289 Other specified disorders of muscle: Secondary | ICD-10-CM | POA: Diagnosis present

## 2017-07-24 DIAGNOSIS — G8252 Quadriplegia, C1-C4 incomplete: Secondary | ICD-10-CM | POA: Diagnosis not present

## 2017-07-24 DIAGNOSIS — R261 Paralytic gait: Secondary | ICD-10-CM | POA: Insufficient documentation

## 2017-07-24 DIAGNOSIS — R278 Other lack of coordination: Secondary | ICD-10-CM | POA: Insufficient documentation

## 2017-07-24 DIAGNOSIS — R2681 Unsteadiness on feet: Secondary | ICD-10-CM | POA: Insufficient documentation

## 2017-07-24 DIAGNOSIS — M6281 Muscle weakness (generalized): Secondary | ICD-10-CM | POA: Diagnosis present

## 2017-07-24 DIAGNOSIS — R41844 Frontal lobe and executive function deficit: Secondary | ICD-10-CM | POA: Diagnosis present

## 2017-07-24 DIAGNOSIS — M25611 Stiffness of right shoulder, not elsewhere classified: Secondary | ICD-10-CM | POA: Diagnosis not present

## 2017-07-24 LAB — URINE CULTURE

## 2017-07-24 LAB — URINE CYTOLOGY ANCILLARY ONLY
Bacterial vaginitis: NEGATIVE
CANDIDA VAGINITIS: NEGATIVE

## 2017-07-24 MED ORDER — NITROFURANTOIN MONOHYD MACRO 100 MG PO CAPS: 100.0000 mg | ORAL_CAPSULE | Freq: Two times a day (BID) | ORAL | 0 refills | Status: AC

## 2017-07-24 NOTE — Progress Notes (Signed)
Called pt and lmovm to return call. Medicine was sent to local pharmacy.

## 2017-07-24 NOTE — Patient Instructions (Signed)
  VOCAL FOLD ADDUCTION EXERCISES  Breathe before each excercise  Yawn-Sigh  Take an easy breath through your mouth while yawning gently. When you breathe out, say an easy, prolonged "AH" 10x, 3x a day  1.  PUSH Bear down against a chair while saying /a/ 5 times, with a lot of effort  2.  PULL up of the seat of a chair with both hands while saying /a/ 5 times with a lot of   Effort  3. Take a breath, hold it for 3 seconds with your mouth open, then release it with a loud exhale. Think of an army or football "HUH." You are using your voice box to hold in your breath.  4. Glide your pitch high and low (as low and high as possible) "Knoll"  5. Glide your pitch low to high "Knoll"  6. Gentle cough/throat clear, then continue voicing for 3-5 seconds  7. Hold your breath, swallow hard, then immediately cough   Do these 10 times each, 3 times a day  Provided by: Taksh Hjort L. ST, [phone] \  
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
## 2017-07-29 ENCOUNTER — Ambulatory Visit: Payer: 59 | Admitting: Physical Therapy

## 2017-07-29 ENCOUNTER — Ambulatory Visit: Payer: 59 | Attending: Physical Medicine and Rehabilitation | Admitting: Occupational Therapy

## 2017-07-29 ENCOUNTER — Ambulatory Visit: Payer: 59

## 2017-07-29 ENCOUNTER — Other Ambulatory Visit: Payer: 59

## 2017-07-29 ENCOUNTER — Encounter: Payer: Self-pay | Admitting: Occupational Therapy

## 2017-07-29 DIAGNOSIS — R41841 Cognitive communication deficit: Secondary | ICD-10-CM | POA: Diagnosis present

## 2017-07-29 DIAGNOSIS — R293 Abnormal posture: Secondary | ICD-10-CM | POA: Diagnosis present

## 2017-07-29 DIAGNOSIS — Z79899 Other long term (current) drug therapy: Secondary | ICD-10-CM

## 2017-07-29 DIAGNOSIS — M25611 Stiffness of right shoulder, not elsewhere classified: Secondary | ICD-10-CM | POA: Diagnosis present

## 2017-07-29 DIAGNOSIS — R41844 Frontal lobe and executive function deficit: Secondary | ICD-10-CM | POA: Diagnosis present

## 2017-07-29 DIAGNOSIS — R498 Other voice and resonance disorders: Secondary | ICD-10-CM | POA: Insufficient documentation

## 2017-07-29 DIAGNOSIS — R2681 Unsteadiness on feet: Secondary | ICD-10-CM | POA: Insufficient documentation

## 2017-07-29 DIAGNOSIS — R278 Other lack of coordination: Secondary | ICD-10-CM | POA: Insufficient documentation

## 2017-07-29 DIAGNOSIS — G8252 Quadriplegia, C1-C4 incomplete: Secondary | ICD-10-CM | POA: Diagnosis present

## 2017-07-29 DIAGNOSIS — M6281 Muscle weakness (generalized): Secondary | ICD-10-CM | POA: Insufficient documentation

## 2017-07-29 DIAGNOSIS — R261 Paralytic gait: Secondary | ICD-10-CM | POA: Insufficient documentation
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
## 2017-07-29 NOTE — Patient Instructions (Signed)
1. Grip Strengthening (Resistive Putty)  Do LONG, HARD squeezes not short little squeezes.  USE THE RED FOR YOUR LEFT HAND AND THE YELLOW FOR YOUR RIGHT HAND. DO THESE EVERY DAY 2 TIMES PER DAY if you want your hands to work better.    Squeeze putty using thumb and all fingers. Repeat _20___ times. Do __2__ sessions per day.   2. Roll putty into a long tube on the table and pinch between your thumb, index and middle finger.  RED FOR LEFT, YELLOW FOR RIGHT.  Do 5 times on each hand. Do 2 sessions per day.      Copyright  VHI. All rights reserved.

## 2017-07-29 NOTE — Patient Instructions (Signed)
Bracing With March in Bridging (Hook-Lying)    With neutral spine, tighten pelvic floor and abdominals and hold. Lift bottom and hold, then march in place. March _10__ times. Do 1-2_ times a day.   Copyright  VHI. All rights reserved.  Bracing With Leg Extension in Bridging (Hook-Lying)    With neutral spine, tighten pelvic floor and abdominals and hold. Lift bottom and hold, straighten one leg, hips still. Reverse sequence. Repeat with other leg. Repeat __10_ times. Do _1-2(Clinic) Caudal Flexion: Hip Flexion - Standing ([place])    Copyright  VHI. All rights reserved.  Abduction    Lift leg up toward ceiling. Return.  Repeat __10__ times each leg. Do 3_sessions per day. (30 reps total is the goal) http://[email or web address].exer.us/386   Copyright  VHI. All rights reserved.  Hip Flexion / Knee Extension: Straight-Leg Raise (Eccentric)    Lie on back. Lift leg with knee straight. Slowly lower leg for 3-5 seconds. __10_ reps per set, 3___ sets per day, _5__ days per week. Lower like elevator, stopping at each floor.  Copyright  VHI. All rights reserved.  Dorsiflexion: Resisted    Facing anchor, tubing around left foot, pull toward face.  Repeat _15___ times per set. Do __3__ sets per session. Do __1-2__ sessions per day.  http://orth.exer.us/9   Copyright  VHI. All rights reserved.     Copyright  VHI. All rights reserved.
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
## 2017-07-30 ENCOUNTER — Encounter: Payer: Self-pay | Admitting: Physical Medicine & Rehabilitation

## 2017-07-30 LAB — LITHIUM LEVEL: LITHIUM LVL: 1 mmol/L (ref 0.6–1.2)
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
## 2017-07-31 ENCOUNTER — Encounter: Payer: Self-pay | Admitting: Occupational Therapy

## 2017-07-31 ENCOUNTER — Ambulatory Visit: Payer: 59

## 2017-07-31 ENCOUNTER — Ambulatory Visit: Payer: 59 | Admitting: Occupational Therapy

## 2017-07-31 DIAGNOSIS — M6281 Muscle weakness (generalized): Secondary | ICD-10-CM

## 2017-07-31 DIAGNOSIS — R2681 Unsteadiness on feet: Secondary | ICD-10-CM

## 2017-07-31 DIAGNOSIS — R41844 Frontal lobe and executive function deficit: Secondary | ICD-10-CM

## 2017-07-31 DIAGNOSIS — M25611 Stiffness of right shoulder, not elsewhere classified: Secondary | ICD-10-CM

## 2017-07-31 DIAGNOSIS — R293 Abnormal posture: Secondary | ICD-10-CM

## 2017-07-31 DIAGNOSIS — G8252 Quadriplegia, C1-C4 incomplete: Secondary | ICD-10-CM | POA: Diagnosis not present

## 2017-07-31 DIAGNOSIS — R41841 Cognitive communication deficit: Secondary | ICD-10-CM

## 2017-07-31 DIAGNOSIS — R498 Other voice and resonance disorders: Secondary | ICD-10-CM
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
## 2017-08-12 ENCOUNTER — Ambulatory Visit: Payer: 59 | Admitting: Physical Therapy

## 2017-08-12 ENCOUNTER — Encounter: Payer: Self-pay | Admitting: Physical Therapy

## 2017-08-12 ENCOUNTER — Ambulatory Visit: Payer: 59 | Admitting: Occupational Therapy

## 2017-08-12 ENCOUNTER — Encounter: Payer: Self-pay | Admitting: Occupational Therapy

## 2017-08-12 DIAGNOSIS — G8252 Quadriplegia, C1-C4 incomplete: Secondary | ICD-10-CM

## 2017-08-12 DIAGNOSIS — R293 Abnormal posture: Secondary | ICD-10-CM

## 2017-08-12 DIAGNOSIS — M6281 Muscle weakness (generalized): Secondary | ICD-10-CM

## 2017-08-12 DIAGNOSIS — R261 Paralytic gait: Secondary | ICD-10-CM

## 2017-08-12 DIAGNOSIS — R41844 Frontal lobe and executive function deficit: Secondary | ICD-10-CM

## 2017-08-12 DIAGNOSIS — M25611 Stiffness of right shoulder, not elsewhere classified: Secondary | ICD-10-CM

## 2017-08-12 DIAGNOSIS — R2681 Unsteadiness on feet: Secondary | ICD-10-CM

## 2017-08-12 NOTE — Patient Instructions (Signed)
Home Program for RUE: Always do lying down!!  Do 2 times per day once in the  Morning and once at night. Kylie Osborn has a video of these on her cell phone as well.     1. Lay on your back and hold ball between your hands. Start with ball on your chest. Raise toward the ceiling until both elbows are straight then return to chest.  Do 10, rest then do 10 more.  If the arm gets tight it is ok to rest it for a moment and let the tone die down.  The helper will help to cue you to keep your elbow straight.    2. Lay on your back and hold the ball between your hands. Raise ball toward the ceiling until elbows are straight.  KEEPING ELBOWS STRAIGHT, raise ball over your head then return ball until it is over your chest. Do 10, rest then do 10 more. It is ok to rest for a moment if the arm gets tight. The helper will help to cue you to keep your elbow straight.    Make sure you do your theraputty exercises 2 times per day as well.

## 2017-08-12 NOTE — Patient Instructions (Addendum)
ANKLE: Eversion, Unilateral (Band)    Place band around right foot. Keeping heel in place, raise toes of banded foot up and away from body. Do not move hip. Hold __3_ seconds. Use ___yellow_____ band. _10-15_ reps per set, _2-3__ sets per day, __2-3_ days per week  Copyright  VHI. All rights reserved.  ANKLE: Plantarflexion, Bilateral - Standing    Stand with upright posture. Raise heels up as high as possible. Use hands for balance only not lifting.  _10-15__ reps per set, _2-3__ sets per day  Copyright  VHI. All rights reserved.  ANKLE: Dorsiflexion - Standing    Stand with upright posture. Raise toes of both feet up at same time. Use hand for balance only. __10-15_ reps per set, __2-3_ sets per day Hold onto a support.  Copyright  VHI. All rights reserved.   Rolling Tennis [place]: Stand near counter or chair back for support but try to not hold on. You can do these on carpet or stand on a towel for control.  Do 10-15 times each direction on each leg. Alternate right & left leg. 1. Roll ball front to back.  2. Roll ball side to side. 3. Roll ball clockwise circles. 4. Roll ball counterclockwise circles.    KNEE: Flexion - Prone    Bend knee. Raise heel toward buttocks. Do not raise hips. Control bend right knee and slowly lower leg in until hip starts to rise. Keep stomach tight. Return to lower leg is upright.  Control bend right knee and slowly lower leg out until other hip starts to rise. Return to lower leg is upright. Lower leg back to bed straightening knee. _10-15__ reps per set, __2-3_ sets per day
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
## 2017-08-13 ENCOUNTER — Ambulatory Visit: Payer: 59 | Admitting: Physical Therapy

## 2017-08-13 ENCOUNTER — Encounter: Payer: Self-pay | Admitting: Physical Therapy

## 2017-08-13 ENCOUNTER — Ambulatory Visit: Payer: 59 | Admitting: Occupational Therapy

## 2017-08-13 DIAGNOSIS — M25611 Stiffness of right shoulder, not elsewhere classified: Secondary | ICD-10-CM

## 2017-08-13 DIAGNOSIS — G8252 Quadriplegia, C1-C4 incomplete: Secondary | ICD-10-CM | POA: Diagnosis not present

## 2017-08-13 DIAGNOSIS — R278 Other lack of coordination: Secondary | ICD-10-CM

## 2017-08-13 DIAGNOSIS — M6281 Muscle weakness (generalized): Secondary | ICD-10-CM

## 2017-08-13 DIAGNOSIS — R41844 Frontal lobe and executive function deficit: Secondary | ICD-10-CM

## 2017-08-13 DIAGNOSIS — R2681 Unsteadiness on feet: Secondary | ICD-10-CM

## 2017-08-13 DIAGNOSIS — R261 Paralytic gait: Secondary | ICD-10-CM
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
## 2017-08-20 ENCOUNTER — Ambulatory Visit: Payer: 59 | Admitting: Physical Therapy

## 2017-08-20 ENCOUNTER — Ambulatory Visit: Payer: 59 | Admitting: Occupational Therapy

## 2017-08-20 ENCOUNTER — Ambulatory Visit: Payer: 59

## 2017-08-20 ENCOUNTER — Encounter: Payer: Self-pay | Admitting: Physical Therapy

## 2017-08-20 DIAGNOSIS — G8252 Quadriplegia, C1-C4 incomplete: Secondary | ICD-10-CM

## 2017-08-20 DIAGNOSIS — R41841 Cognitive communication deficit: Secondary | ICD-10-CM

## 2017-08-20 DIAGNOSIS — R278 Other lack of coordination: Secondary | ICD-10-CM

## 2017-08-20 DIAGNOSIS — R261 Paralytic gait: Secondary | ICD-10-CM

## 2017-08-20 DIAGNOSIS — M6281 Muscle weakness (generalized): Secondary | ICD-10-CM

## 2017-08-20 DIAGNOSIS — R498 Other voice and resonance disorders: Secondary | ICD-10-CM

## 2017-08-20 DIAGNOSIS — R41844 Frontal lobe and executive function deficit: Secondary | ICD-10-CM

## 2017-08-20 DIAGNOSIS — R2681 Unsteadiness on feet: Secondary | ICD-10-CM

## 2017-08-20 DIAGNOSIS — M25611 Stiffness of right shoulder, not elsewhere classified: Secondary | ICD-10-CM
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
## 2017-08-22 ENCOUNTER — Encounter: Payer: Self-pay | Admitting: Physical Therapy

## 2017-08-22 ENCOUNTER — Ambulatory Visit: Payer: 59 | Admitting: Occupational Therapy

## 2017-08-22 ENCOUNTER — Ambulatory Visit: Payer: 59

## 2017-08-22 ENCOUNTER — Ambulatory Visit: Payer: 59 | Admitting: Physical Therapy

## 2017-08-22 DIAGNOSIS — R41841 Cognitive communication deficit: Secondary | ICD-10-CM

## 2017-08-22 DIAGNOSIS — G8252 Quadriplegia, C1-C4 incomplete: Secondary | ICD-10-CM

## 2017-08-22 DIAGNOSIS — M25611 Stiffness of right shoulder, not elsewhere classified: Secondary | ICD-10-CM

## 2017-08-22 DIAGNOSIS — R261 Paralytic gait: Secondary | ICD-10-CM

## 2017-08-22 DIAGNOSIS — R2681 Unsteadiness on feet: Secondary | ICD-10-CM

## 2017-08-22 DIAGNOSIS — M6281 Muscle weakness (generalized): Secondary | ICD-10-CM

## 2017-08-22 DIAGNOSIS — R293 Abnormal posture: Secondary | ICD-10-CM

## 2017-08-22 DIAGNOSIS — R278 Other lack of coordination: Secondary | ICD-10-CM

## 2017-08-22 DIAGNOSIS — R41844 Frontal lobe and executive function deficit: Secondary | ICD-10-CM

## 2017-08-22 DIAGNOSIS — R498 Other voice and resonance disorders: Secondary | ICD-10-CM
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
## 2017-08-26 ENCOUNTER — Ambulatory Visit: Payer: 59 | Attending: Physical Medicine and Rehabilitation | Admitting: Physical Therapy

## 2017-08-26 ENCOUNTER — Ambulatory Visit: Payer: 59

## 2017-08-26 ENCOUNTER — Ambulatory Visit: Payer: 59 | Admitting: Occupational Therapy

## 2017-08-26 DIAGNOSIS — R293 Abnormal posture: Secondary | ICD-10-CM | POA: Diagnosis present

## 2017-08-26 DIAGNOSIS — R278 Other lack of coordination: Secondary | ICD-10-CM | POA: Diagnosis present

## 2017-08-26 DIAGNOSIS — R498 Other voice and resonance disorders: Secondary | ICD-10-CM | POA: Insufficient documentation

## 2017-08-26 DIAGNOSIS — R41844 Frontal lobe and executive function deficit: Secondary | ICD-10-CM | POA: Diagnosis present

## 2017-08-26 DIAGNOSIS — M6289 Other specified disorders of muscle: Secondary | ICD-10-CM | POA: Insufficient documentation

## 2017-08-26 DIAGNOSIS — M6281 Muscle weakness (generalized): Secondary | ICD-10-CM

## 2017-08-26 DIAGNOSIS — R2681 Unsteadiness on feet: Secondary | ICD-10-CM | POA: Insufficient documentation

## 2017-08-26 DIAGNOSIS — R41841 Cognitive communication deficit: Secondary | ICD-10-CM | POA: Diagnosis present

## 2017-08-26 DIAGNOSIS — M25611 Stiffness of right shoulder, not elsewhere classified: Secondary | ICD-10-CM | POA: Insufficient documentation

## 2017-08-26 DIAGNOSIS — R261 Paralytic gait: Secondary | ICD-10-CM | POA: Insufficient documentation

## 2017-08-26 DIAGNOSIS — G8252 Quadriplegia, C1-C4 incomplete: Secondary | ICD-10-CM | POA: Insufficient documentation
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
## 2017-08-26 NOTE — Patient Instructions (Signed)
Divided Attention ideas - any can be done together  Playing a card game either by yourself or with someone  Playing a board game with someone  Sorting playing cards  Writing down commercials on TV or the radio  Writing down news stories on TV or radio  Working outside with yard work, gardening, etc.  Washing dishes  Recite state names and capitals  Complete long division or 2 or 3 digit multiplication problems  Listen to a speech online (i.e., YouTube), to online news (NPR, CNN, etc.), or to a family member, and give pertinent information from the speech, news story, or conversation after it is over  Empty the dishwasher  Complete paper/pencil puzzles (sudoku, crosswords, word search)  Complete puzzles online (www.lumosity.com -computer/tablet based, or www.constanttherapy.com - iPad based)  Therapy worksheets/exercises  Put together a puzzle  State coins needed to make a certain change amount  Have someone read a portion of a novel to you, and you give a summary    Start at a level that you are challenged by, but also where you can see some success  
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
## 2017-08-26 NOTE — Patient Instructions (Addendum)
HIP: Extension / KNEE: Flexion - Prone    Bend knee, squeeze glutes. Raise leg up. _15_ reps per set, __2_ sets per day, _5 days per week  USE RED BAND around both ankles - BEND RIGHT KNEE - HOLD 3 secs - slowly lower  Copyright  VHI. All rights reserved.  Knee Flexion: Resisted (Sitting)    Sit with band under left foot and looped around ankle of supported leg. Pull unsupported leg back. Repeat __15__ times per set. Do __2__ sets per session. Do _1-2__ sessions per day.  http://orth.exer.us/695   Copyright  VHI. All rights reserved.

## 2017-08-27 ENCOUNTER — Encounter: Payer: Self-pay | Admitting: Physical Medicine & Rehabilitation

## 2017-08-27 ENCOUNTER — Encounter: Payer: 59 | Attending: Physical Medicine & Rehabilitation | Admitting: Physical Medicine & Rehabilitation

## 2017-08-27 ENCOUNTER — Ambulatory Visit: Payer: 59 | Admitting: Occupational Therapy

## 2017-08-27 ENCOUNTER — Ambulatory Visit: Payer: 59 | Admitting: Physical Therapy

## 2017-08-27 DIAGNOSIS — Z87820 Personal history of traumatic brain injury: Secondary | ICD-10-CM

## 2017-08-27 DIAGNOSIS — F4320 Adjustment disorder, unspecified: Secondary | ICD-10-CM | POA: Insufficient documentation

## 2017-08-27 DIAGNOSIS — S14109S Unspecified injury at unspecified level of cervical spinal cord, sequela: Secondary | ICD-10-CM | POA: Insufficient documentation

## 2017-08-27 NOTE — Patient Instructions (Signed)
DECREASE LITHIUM TO  AT BEDTIME UNTIL BOTTLE IS EMPTY   PLEASE FEEL FREE TO CALL OUR OFFICE WITH ANY PROBLEMS OR QUESTIONS [phone])
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
## 2017-08-29 ENCOUNTER — Other Ambulatory Visit: Payer: Self-pay | Admitting: General Practice

## 2017-08-29 MED ORDER — BACLOFEN 10 MG PO TABS: 5.0000 mg | ORAL_TABLET | Freq: Three times a day (TID) | ORAL | 1 refills | Status: DC

## 2017-08-29 NOTE — Telephone Encounter (Signed)
See faxed Rx. I have signed. Needs to be fax in.

## 2017-08-29 NOTE — Telephone Encounter (Signed)
Medication filled to pharmacy as requested.   
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
## 2017-09-03 ENCOUNTER — Ambulatory Visit: Payer: 59

## 2017-09-03 ENCOUNTER — Ambulatory Visit: Payer: 59 | Admitting: Occupational Therapy

## 2017-09-03 ENCOUNTER — Ambulatory Visit: Payer: 59 | Admitting: Physical Therapy

## 2017-09-03 DIAGNOSIS — G8252 Quadriplegia, C1-C4 incomplete: Secondary | ICD-10-CM

## 2017-09-03 DIAGNOSIS — R498 Other voice and resonance disorders: Secondary | ICD-10-CM

## 2017-09-03 DIAGNOSIS — R41844 Frontal lobe and executive function deficit: Secondary | ICD-10-CM

## 2017-09-03 DIAGNOSIS — R278 Other lack of coordination: Secondary | ICD-10-CM

## 2017-09-03 DIAGNOSIS — R41841 Cognitive communication deficit: Secondary | ICD-10-CM

## 2017-09-03 DIAGNOSIS — R261 Paralytic gait: Secondary | ICD-10-CM

## 2017-09-03 DIAGNOSIS — M25611 Stiffness of right shoulder, not elsewhere classified: Secondary | ICD-10-CM

## 2017-09-03 DIAGNOSIS — R2681 Unsteadiness on feet: Secondary | ICD-10-CM

## 2017-09-03 DIAGNOSIS — M6281 Muscle weakness (generalized): Secondary | ICD-10-CM | POA: Diagnosis not present

## 2017-09-03 NOTE — Patient Instructions (Addendum)
Vocal Fold Exercises (for voice and swallowing)  . Super-supraglottic swallow o Take a breath & hold it,  o Bear down, swallow, gently cough o 5 repetitions, 2x a day  (8-10 reps if desired)  . "HA!" with upward pull o  pulling up on chair with your arms and say "ha!" in a loud voice o 10 repetitions,  2x a day  . "HA!" with downward push         -    pulling up on chair with your arms and say "ha!" in a loud voice o 10 repetitions,  2x a day  . Vocal fold adduction o Say "Huh!" with mouth open, & hold your breath for 3 seconds o 10 repetitions, 2x a day  . Supraglottic swallow o Take a breath & hold it o Swallow - gently cough o 5 repetitions, 2x a day  (8-10 reps if desired)  Exercises 2,3, and 4 could be done twice a day with increased repetitions (12-15x), if desired.  If voice becomes worse, stop doing these exercises or decrease reps by half
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
## 2017-09-05 ENCOUNTER — Ambulatory Visit: Payer: 59

## 2017-09-05 ENCOUNTER — Ambulatory Visit: Payer: 59 | Admitting: Physical Therapy

## 2017-09-05 ENCOUNTER — Ambulatory Visit: Payer: 59 | Admitting: Occupational Therapy

## 2017-09-08 ENCOUNTER — Ambulatory Visit (INDEPENDENT_AMBULATORY_CARE_PROVIDER_SITE_OTHER): Payer: 59 | Admitting: Neurology

## 2017-09-08 ENCOUNTER — Encounter: Payer: Self-pay | Admitting: Neurology

## 2017-09-08 VITALS — BP 95/66 | HR 61 | Ht 67.0 in | Wt 131.8 lb

## 2017-09-08 DIAGNOSIS — Z87820 Personal history of traumatic brain injury: Secondary | ICD-10-CM

## 2017-09-08 DIAGNOSIS — R269 Unspecified abnormalities of gait and mobility: Secondary | ICD-10-CM | POA: Diagnosis not present

## 2017-09-08 DIAGNOSIS — S14109S Unspecified injury at unspecified level of cervical spinal cord, sequela: Secondary | ICD-10-CM
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
## 2017-09-09 ENCOUNTER — Encounter: Payer: Self-pay | Admitting: Occupational Therapy

## 2017-09-09 ENCOUNTER — Encounter: Payer: Self-pay | Admitting: Physical Therapy

## 2017-09-09 ENCOUNTER — Ambulatory Visit: Payer: 59 | Admitting: Occupational Therapy

## 2017-09-09 ENCOUNTER — Ambulatory Visit: Payer: 59 | Admitting: Speech Pathology

## 2017-09-09 ENCOUNTER — Ambulatory Visit: Payer: 59 | Admitting: Physical Therapy

## 2017-09-09 DIAGNOSIS — R2681 Unsteadiness on feet: Secondary | ICD-10-CM

## 2017-09-09 DIAGNOSIS — G8252 Quadriplegia, C1-C4 incomplete: Secondary | ICD-10-CM

## 2017-09-09 DIAGNOSIS — R293 Abnormal posture: Secondary | ICD-10-CM

## 2017-09-09 DIAGNOSIS — M6281 Muscle weakness (generalized): Secondary | ICD-10-CM

## 2017-09-09 DIAGNOSIS — R261 Paralytic gait: Secondary | ICD-10-CM

## 2017-09-09 DIAGNOSIS — M25611 Stiffness of right shoulder, not elsewhere classified: Secondary | ICD-10-CM

## 2017-09-09 DIAGNOSIS — R278 Other lack of coordination: Secondary | ICD-10-CM

## 2017-09-09 DIAGNOSIS — R41844 Frontal lobe and executive function deficit: Secondary | ICD-10-CM

## 2017-09-09 DIAGNOSIS — M6289 Other specified disorders of muscle: Secondary | ICD-10-CM

## 2017-09-09 DIAGNOSIS — R41841 Cognitive communication deficit: Secondary | ICD-10-CM
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
## 2017-09-11 ENCOUNTER — Ambulatory Visit: Payer: 59 | Admitting: Occupational Therapy

## 2017-09-11 ENCOUNTER — Ambulatory Visit (INDEPENDENT_AMBULATORY_CARE_PROVIDER_SITE_OTHER): Payer: 59 | Admitting: Family Medicine

## 2017-09-11 ENCOUNTER — Encounter: Payer: Self-pay | Admitting: Physical Therapy

## 2017-09-11 ENCOUNTER — Ambulatory Visit: Payer: 59 | Admitting: Physical Therapy

## 2017-09-11 ENCOUNTER — Telehealth: Payer: Self-pay | Admitting: Occupational Therapy

## 2017-09-11 ENCOUNTER — Ambulatory Visit: Payer: 59 | Admitting: Speech Pathology

## 2017-09-11 ENCOUNTER — Encounter: Payer: Self-pay | Admitting: Family Medicine

## 2017-09-11 ENCOUNTER — Encounter: Payer: Self-pay | Admitting: Occupational Therapy

## 2017-09-11 VITALS — BP 100/70 | HR 67 | Temp 98.7°F | Resp 16 | Ht 67.0 in | Wt 134.2 lb

## 2017-09-11 DIAGNOSIS — M6281 Muscle weakness (generalized): Secondary | ICD-10-CM | POA: Diagnosis not present

## 2017-09-11 DIAGNOSIS — G8252 Quadriplegia, C1-C4 incomplete: Secondary | ICD-10-CM

## 2017-09-11 DIAGNOSIS — Z23 Encounter for immunization: Secondary | ICD-10-CM | POA: Diagnosis not present

## 2017-09-11 DIAGNOSIS — R278 Other lack of coordination: Secondary | ICD-10-CM

## 2017-09-11 DIAGNOSIS — R41841 Cognitive communication deficit: Secondary | ICD-10-CM

## 2017-09-11 DIAGNOSIS — R41844 Frontal lobe and executive function deficit: Secondary | ICD-10-CM

## 2017-09-11 DIAGNOSIS — R293 Abnormal posture: Secondary | ICD-10-CM

## 2017-09-11 DIAGNOSIS — L03116 Cellulitis of left lower limb: Secondary | ICD-10-CM

## 2017-09-11 DIAGNOSIS — M25611 Stiffness of right shoulder, not elsewhere classified: Secondary | ICD-10-CM

## 2017-09-11 DIAGNOSIS — R261 Paralytic gait: Secondary | ICD-10-CM

## 2017-09-11 DIAGNOSIS — R2681 Unsteadiness on feet: Secondary | ICD-10-CM

## 2017-09-11 MED ORDER — CEPHALEXIN 500 MG PO CAPS: 500.0000 mg | ORAL_CAPSULE | Freq: Three times a day (TID) | ORAL | 0 refills | Status: AC
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
## 2017-09-11 NOTE — Patient Instructions (Addendum)
   Kylie Osborn has made good progress with her attention, focus and problem solving - her cognition and volume have improved. I am not recommending her to continue ST after her next visit. She has met her goals. If you have any questions feel free to call Mickel Baas or Glendell Docker - speech therapy [phone] or you are welcome to attend her last ST session next week. Thanks! Mickel Baas  Double check you work!!  Accommodations: Let your professors know you may need extra time for tests or assignments, may need to take an exam in a quiet room with less distractions and will need to use technology to compensate for her arm weakness   Tips to help facilitate better attention, concentration, focus - especially if you are tired or not feeling   Do harder, longer tasks when you are most alert/awake  Break down larger tasks into small parts  Limit distractions of TV, radio, conversation, e mails/texts, appliance noise, etc - if a job is important, do it in a quiet room  Be aware of how you are functioning in high stimulation environments such as large stores, parties, restaurants - any place with lots of lights, noise, signs etc  Group conversations may be more difficult to process than one on one conversations  Give yourself extra time to process conversation, reading materials, directions or information from your healthcare providers  Organization is key - clutters of laundry, mail, paperwork, dirty dishes - all make it more difficult to concentrate  Before you start a task, have all the needed supplies, directions, recipes ready and organized. This way you don't have to go looking for something in the middle of a task and become distracted.   Be aware of fatigue - take rests or breaks when needed to re-group and re-focus  Use voice to text option for written assignments if that is available to you

## 2017-09-11 NOTE — Patient Instructions (Signed)
Follow up as needed or as scheduled Start the Keflex 3x/day x7 days- take w/ food- for the cellulitis Apply antibiotic ointment to the area twice daily If going out, cover the area but you can leave it open to the air at home Call with any questions or concerns Hang in there!!!
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
## 2017-09-11 NOTE — Telephone Encounter (Signed)
Attempted to call mother per mother's request.  No answer and left message for mother that second correspondence was sent home with pt to see if pt's mother wishes us to continue with therapy beyond insured visits.  Left number for pt's mother to return the call if she has any questions after reading the correspondence.
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
## 2017-09-16 ENCOUNTER — Ambulatory Visit (INDEPENDENT_AMBULATORY_CARE_PROVIDER_SITE_OTHER): Payer: 59

## 2017-09-16 ENCOUNTER — Encounter: Payer: Self-pay | Admitting: Physical Therapy

## 2017-09-16 ENCOUNTER — Ambulatory Visit: Payer: 59 | Admitting: Occupational Therapy

## 2017-09-16 ENCOUNTER — Encounter: Payer: 59 | Admitting: Speech Pathology

## 2017-09-16 ENCOUNTER — Ambulatory Visit: Payer: 59 | Admitting: Physical Therapy

## 2017-09-16 DIAGNOSIS — Z87820 Personal history of traumatic brain injury: Secondary | ICD-10-CM

## 2017-09-16 DIAGNOSIS — M6281 Muscle weakness (generalized): Secondary | ICD-10-CM

## 2017-09-16 DIAGNOSIS — G8252 Quadriplegia, C1-C4 incomplete: Secondary | ICD-10-CM

## 2017-09-16 DIAGNOSIS — S14109S Unspecified injury at unspecified level of cervical spinal cord, sequela: Secondary | ICD-10-CM

## 2017-09-16 DIAGNOSIS — R2681 Unsteadiness on feet: Secondary | ICD-10-CM

## 2017-09-16 DIAGNOSIS — R278 Other lack of coordination: Secondary | ICD-10-CM

## 2017-09-16 DIAGNOSIS — R261 Paralytic gait: Secondary | ICD-10-CM

## 2017-09-16 MED ORDER — GADOPENTETATE DIMEGLUMINE 469.01 MG/ML IV SOLN: 20.0000 mL | Freq: Once | INTRAVENOUS | Status: AC | PRN

## 2017-09-16 NOTE — Patient Instructions (Addendum)
Piriformis Stretch, Sitting    Sit, one ankle on opposite knee, same-side hand on crossed knee. Push down on knee, keeping spine straight. Lean torso forward, with flat back, until tension is felt in hamstrings and gluteals of crossed-leg side. Hold __20-30_ seconds.  Repeat _2-3__ times per session. Do __2-3_ sessions per day.  Copyright  VHI. All rights reserved.  Iliotibial Band Stretch, Standing    Stand, one hand on wall, same-side leg behind other leg. Lean hips toward wall, bending front knee, keeping back knee straight. Hold _20-30__ seconds. Change foot position, lean to same-side. Hold _20-30__ seconds. Repeat __2-3_ times per session. Do _2-3_ sessions per day.  Copyright  VHI. All rights reserved.
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
## 2017-09-18 ENCOUNTER — Ambulatory Visit: Payer: 59 | Admitting: Physical Therapy

## 2017-09-18 ENCOUNTER — Encounter: Payer: 59 | Admitting: Speech Pathology

## 2017-09-18 ENCOUNTER — Encounter: Payer: Self-pay | Admitting: Occupational Therapy

## 2017-09-18 ENCOUNTER — Ambulatory Visit: Payer: 59 | Admitting: Occupational Therapy

## 2017-09-18 ENCOUNTER — Telehealth: Payer: Self-pay | Admitting: Family Medicine

## 2017-09-18 ENCOUNTER — Telehealth: Payer: Self-pay | Admitting: Occupational Therapy

## 2017-09-18 DIAGNOSIS — S14109S Unspecified injury at unspecified level of cervical spinal cord, sequela: Secondary | ICD-10-CM

## 2017-09-18 DIAGNOSIS — R41844 Frontal lobe and executive function deficit: Secondary | ICD-10-CM

## 2017-09-18 DIAGNOSIS — R2681 Unsteadiness on feet: Secondary | ICD-10-CM

## 2017-09-18 DIAGNOSIS — M6281 Muscle weakness (generalized): Secondary | ICD-10-CM

## 2017-09-18 DIAGNOSIS — G8252 Quadriplegia, C1-C4 incomplete: Secondary | ICD-10-CM

## 2017-09-18 DIAGNOSIS — R293 Abnormal posture: Secondary | ICD-10-CM

## 2017-09-18 DIAGNOSIS — R278 Other lack of coordination: Secondary | ICD-10-CM

## 2017-09-18 DIAGNOSIS — M25611 Stiffness of right shoulder, not elsewhere classified: Secondary | ICD-10-CM

## 2017-09-18 NOTE — Telephone Encounter (Signed)
Please advise if ok and DX?

## 2017-09-18 NOTE — Telephone Encounter (Signed)
Mom states that pt needs a referral to [facility] for PT.

## 2017-09-18 NOTE — Telephone Encounter (Signed)
Ok for referral.  Dx- cervical spinal cord injury sequela

## 2017-09-18 NOTE — Telephone Encounter (Signed)
Referral placed and pt mom made aware.

## 2017-09-18 NOTE — Telephone Encounter (Signed)
Dr.Swartz, Can you please re order PT eval and tx for this pt?  She was seeing PT but her primary insurance ran out.  She has now been approved for Medicaid so mom would like her to restart PT (we need a new order because PT had already discharged her). Thanks.

## 2017-09-18 NOTE — Patient Instructions (Addendum)
Home Program for RUE: (First given on 08/12/2017).  Always do lying down!!  Do 2 times per day once in the  Morning and once at night. Mel AlmondJada has a video of these on her cell phone as well.     1. Lay on your back and hold ball between your hands. Start with ball on your chest. Put pressure through the ball and focus on controlling it.  Raise toward the ceiling until both elbows are straight then return to chest.  Do 15, rest then do 15 more.  If the arm gets tight it is ok to rest it for a moment and let the tone die down.  This means by the end of the day you should be doing 60 total.   2. Lay on your back and hold the ball between your hands. Raise ball toward the ceiling until elbows are straight.  Put pressure through the ball and focus on controlling the ball.  KEEPING ELBOWS STRAIGHT, raise ball over your head then return ball until it is over your chest. Do 10, rest then do 10 more. It is ok to rest for a moment if the arm gets tight.  This means by the end of the day you should be doing 60 total.    1. Grip Strengthening (Resistive Putty) red putty for BOTH hands    Squeeze putty using thumb and all fingers. Repeat _20___ times. Do __2__ sessions per day.  This means 40 by the end of the day.    2. Roll putty into tube on table and pinch between thumb, index and ring finger.  Do 5 times each hand.  Do 2 sessions.  This means by the end of the day you will do 20 total for each hand.    Copyright  VHI. All rights reserved.   Make sure you do your theraputty exercises 2 times per day as well.
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
## 2017-09-19 ENCOUNTER — Telehealth: Payer: Self-pay | Admitting: Neurology

## 2017-09-19 NOTE — Telephone Encounter (Signed)
done

## 2017-09-19 NOTE — Telephone Encounter (Signed)
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 IMPRESSION:  Abnormal MRI scan of the cervical spine showing stable postoperative changes of posterior cervical fusion from C2-C5 with myelomalacia and changes of remote spinal cord injury from C2-C4. There are minor disc signal abnormalities but no frank compression.   IMPRESSION:  Unremarkable MRI scan of the brain without contrast.

## 2017-09-22 NOTE — Telephone Encounter (Signed)
Attempted to reach patient by phone - rang repeatedly with no answer or machine.

## 2017-09-22 NOTE — Telephone Encounter (Signed)
Spoke to patient - she is aware of results and will keep her pending follow up for further review. 

## 2017-09-23 ENCOUNTER — Ambulatory Visit: Payer: 59 | Admitting: Physical Therapy

## 2017-09-23 ENCOUNTER — Encounter: Payer: 59 | Admitting: Occupational Therapy

## 2017-09-23 ENCOUNTER — Encounter: Payer: 59 | Admitting: Speech Pathology

## 2017-09-25 ENCOUNTER — Encounter: Payer: 59 | Admitting: Occupational Therapy

## 2017-09-25 ENCOUNTER — Encounter: Payer: 59 | Admitting: Speech Pathology

## 2017-09-25 ENCOUNTER — Ambulatory Visit: Payer: 59 | Admitting: Physical Therapy

## 2017-09-29 ENCOUNTER — Encounter: Payer: Self-pay | Admitting: Physical Therapy

## 2017-09-29 ENCOUNTER — Encounter: Payer: 59 | Attending: Physical Medicine & Rehabilitation | Admitting: Physical Medicine & Rehabilitation

## 2017-09-29 ENCOUNTER — Encounter: Payer: Self-pay | Admitting: Physical Medicine & Rehabilitation

## 2017-09-29 ENCOUNTER — Ambulatory Visit: Payer: 59 | Attending: Physical Medicine and Rehabilitation | Admitting: Physical Therapy

## 2017-09-29 VITALS — BP 115/78 | HR 56

## 2017-09-29 DIAGNOSIS — M6281 Muscle weakness (generalized): Secondary | ICD-10-CM | POA: Diagnosis not present

## 2017-09-29 DIAGNOSIS — S14109S Unspecified injury at unspecified level of cervical spinal cord, sequela: Secondary | ICD-10-CM | POA: Insufficient documentation

## 2017-09-29 DIAGNOSIS — F4320 Adjustment disorder, unspecified: Secondary | ICD-10-CM | POA: Insufficient documentation

## 2017-09-29 DIAGNOSIS — Z87820 Personal history of traumatic brain injury: Secondary | ICD-10-CM

## 2017-09-29 DIAGNOSIS — M7062 Trochanteric bursitis, left hip: Secondary | ICD-10-CM | POA: Insufficient documentation

## 2017-09-29 DIAGNOSIS — R2681 Unsteadiness on feet: Secondary | ICD-10-CM | POA: Diagnosis present

## 2017-09-29 DIAGNOSIS — R2689 Other abnormalities of gait and mobility: Secondary | ICD-10-CM

## 2017-09-29 DIAGNOSIS — M6289 Other specified disorders of muscle: Secondary | ICD-10-CM | POA: Insufficient documentation

## 2017-09-29 MED ORDER — DICLOFENAC SODIUM 1 % TD GEL: 1.0000 "application " | Freq: Three times a day (TID) | TRANSDERMAL | 4 refills | Status: DC
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
## 2017-09-29 NOTE — Progress Notes (Signed)
Subjective:    Patient ID: Kylie Osborn, female    DOB: 09-Nov-1996, 21 y.o.   MRN: [id number]  HPI   This is a follow-up visit for Kylie Osborn who is here regarding her traumatic brain injury and cervical spine fractures.  She continues to work with physical occupational and speech therapy at neuro rehab.  She is making progressive gains.  She is having pain in her left hip along the outer half which began about 2-3 weeks ago.  Therapy did provide her some stretches to do to work on the left hip.  She also developed a wound along the lateral aspect of the ankle which is healing. She was on abx briefly.   She is noticing a tingling/"funny" feeling along the toes of the left foot.  It is not painful.  Coming off the lithium has helped her speech and general arousal.    She saw neurology who ordered an MRI/MRA.  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  She will need to stay on a full coated aspirin daily.  Pain Inventory Average Pain 0 Pain Right Now 7 My pain is intermittent  In the last 24 hours, has pain interfered with the following? General activity 0 Relation with others 0 Enjoyment of life 0 What TIME of day is your pain at its worst? night Sleep (in general) Good  Pain is worse with: some activites Pain improves with: rest and heat/ice Relief from Meds: .  Mobility walk without assistance ability to climb steps?  yes do you drive?  no  Function disabled: date disabled 03-2017  Neuro/Psych No problems in this area  Prior Studies Any changes since last visit?  no  Physicians involved in your care Any changes since last visit?  no   Family History  Problem Relation Age of Onset  . Hypertension Mother   . Liver disease Father   . Diabetes Maternal Grandmother   . Hypertension Maternal Grandfather    Social History   Socioeconomic History  . Marital status: Single    Spouse name: Not on file  . Number of  children: 0  . Years of education: college student  . Highest education level: Not on file  Social Needs  . Financial resource strain: Not on file  . Food insecurity - worry: Not on file  . Food insecurity - inability: Not on file  . Transportation needs - medical: Not on file  . Transportation needs - non-medical: Not on file  Occupational History  . Not on file  Tobacco Use  . Smoking status: Never Smoker  . Smokeless tobacco: Never Used  Substance and Sexual Activity  . Alcohol use: No  . Drug use: No  . Sexual activity: Not on file  Other Topics Concern  . Not on file  Social History Narrative   Lives at home with parents.   1 cup caffeine per day.   Ambidextrous.   No past surgical history on file. Past Medical History:  Diagnosis Date  . Strep pharyngitis 10/22/2015  . TBI (traumatic brain injury) (HCC) 04/06/2017   There were no vitals taken for this visit.  Opioid Risk Score:   Fall Risk Score:  `1  Depression screen PHQ 2/9  Depression screen [facility] 2/9 09/11/2017 08/27/2017 07/21/2017 02/01/2016  Decreased Interest 0 0 0 0  Down, Depressed, Hopeless 0 0 0 0  PHQ - 2 Score 0 0 0 0  Altered sleeping 0  0 0 -  Tired, decreased energy 0 1 0 -  Change in appetite 0 0 0 -  Feeling bad or failure about yourself  0 0 0 -  Trouble concentrating 0 0 0 -  Moving slowly or fidgety/restless 0 0 0 -  Suicidal thoughts 0 - 0 -  PHQ-9 Score 0 1 0 -  Difficult doing work/chores Not difficult at all - Not difficult at all -     Review of Systems  Constitutional: Negative.   HENT: Negative.   Eyes: Negative.   Respiratory: Negative.   Cardiovascular: Negative.   Gastrointestinal: Negative.   Endocrine: Negative.   Genitourinary: Negative.   Musculoskeletal: Negative.   Skin: Negative.   Allergic/Immunologic: Negative.   Neurological: Negative.   Hematological: Negative.   Psychiatric/Behavioral: Negative.   All other systems reviewed and are negative.        Objective:   Physical Exam  General: Alert and oriented x 3, No apparent distress HEENT: perrl Neck: Supple without JVD or lymphadenopathy Heart: Regular rate Chest:  Normal effort Abdomen: Soft, non-tender, non-distended, bowel sounds positive. Extremities: No clubbing, cyanosis, or edema. Pulses are 2+ Skin: Clean and intact without signs of breakdown. Scars along right hand/wrist.  Neuro: Pt is A/O x 3.  Distracted at times.  Needs cues to stay on task.  Did follow all simple commands without difficulty.  Reasonable insight and awareness.  Cranial nerves 2-12 are intact. Sensory exam is normal. Reflexes are 3+ RUE and RLE. Motor 4/5 RUE prox to distal. RLE: 4 to 4+/5 prox to distal. Nearly 5/5 LUE and LLE. Gait mechanics improved quite a bit. Favoring the left hip slightly during weight bearing. .   Musculoskeletal:  Left greater trochanter region slightly tender to palpation.  Cross leg maneuver equivocal.  No tenderness in the low back with palpation.  Right range of motion at the shoulder still somewhat limited but functional. Psych:  Affect more animated but somewhat distracted.        Assessment & Plan:   1. C3 spinal cord injury, incomplete, suffered 04/06/17.  2. Mild TBI with ongoing higher level cognitive deficits 3. Adjustment reaction 4. Left hip pain. Likely greater trochanteric bursitis, TFL irritated    Plan: 1. Decrease lithium to off over the next few days.   2. Continue with outpt therapies to address gait, FMC, higher level balance and cognition. She is making progress 3. Referral to neuro-psych testing with Dr. Kieth Osborn.  She has designs on going back to school this spring semester.  I suspect this may be a challenge for her unfortunately.  The testing will give her some objective feedback. 4. Discussed importance of regular sleep to potentiate her cognition and motor skills. At least 8 hours per night is recommended 5. Continue baclofen low dose for  spasms.  6. Trial of heat, ice ,voltaren gel and stretches per PT for left greater Troch bursitis. Normal gait mechanics will help also 7.  Follow up in about 6 weeks. 15 minutes of face to face patient care time were spent during this visit. All questions were encouraged and answered.

## 2017-09-29 NOTE — Patient Instructions (Addendum)
PLEASE FEEL FREE TO CALL OUR OFFICE WITH ANY PROBLEMS OR QUESTIONS [phone])  LEFT HIP PAIN:  ICE AFTER ACTIVITIES HEAT BEFORE ACTIVITIES/STRETCHING STRETCHING AS PT HAS BEEN SHOWING YOU VOLTAREN GEL THREE X DAILY

## 2017-09-30 ENCOUNTER — Ambulatory Visit: Payer: 59 | Admitting: Physical Therapy

## 2017-10-02 ENCOUNTER — Ambulatory Visit: Payer: 59 | Admitting: Physical Therapy

## 2017-10-05 ENCOUNTER — Emergency Department (HOSPITAL_COMMUNITY)
Admission: EM | Admit: 2017-10-05 | Discharge: 2017-10-05 | Disposition: A | Discharge status: Home / Self Care | Payer: 59 | Attending: Emergency Medicine | Admitting: Emergency Medicine

## 2017-10-05 ENCOUNTER — Other Ambulatory Visit: Payer: Self-pay

## 2017-10-05 ENCOUNTER — Encounter (HOSPITAL_COMMUNITY): Payer: Self-pay | Admitting: Emergency Medicine

## 2017-10-05 DIAGNOSIS — R1084 Generalized abdominal pain: Secondary | ICD-10-CM | POA: Insufficient documentation

## 2017-10-05 DIAGNOSIS — Z7982 Long term (current) use of aspirin: Secondary | ICD-10-CM | POA: Diagnosis not present

## 2017-10-05 DIAGNOSIS — Z79899 Other long term (current) drug therapy: Secondary | ICD-10-CM | POA: Diagnosis not present

## 2017-10-05 DIAGNOSIS — R109 Unspecified abdominal pain: Secondary | ICD-10-CM | POA: Diagnosis present

## 2017-10-05 LAB — URINALYSIS, ROUTINE W REFLEX MICROSCOPIC
Bilirubin Urine: NEGATIVE
GLUCOSE, UA: NEGATIVE mg/dL
HGB URINE DIPSTICK: NEGATIVE
KETONES UR: NEGATIVE mg/dL
Leukocytes, UA: NEGATIVE
Nitrite: NEGATIVE
PH: 5 (ref 5.0–8.0)
PROTEIN: NEGATIVE mg/dL
Specific Gravity, Urine: >1.03 — ABNORMAL HIGH (ref 1.005–1.030)

## 2017-10-05 LAB — CBC
HEMATOCRIT: 40.1 % (ref 36.0–46.0)
Hemoglobin: 12.9 g/dL (ref 12.0–15.0)
MCH: 32.7 pg (ref 26.0–34.0)
MCHC: 32.2 g/dL (ref 30.0–36.0)
MCV: 101.5 fL — AB (ref 78.0–100.0)
PLATELETS: 288 10*3/uL (ref 150–400)
RBC: 3.95 MIL/uL (ref 3.87–5.11)
RDW: 12.1 % (ref 11.5–15.5)
WBC: 9 10*3/uL (ref 4.0–10.5)

## 2017-10-05 LAB — COMPREHENSIVE METABOLIC PANEL
ALK PHOS: 114 U/L (ref 38–126)
ALT: 15 U/L (ref 14–54)
ANION GAP: 8 (ref 5–15)
AST: 43 U/L — ABNORMAL HIGH (ref 15–41)
Albumin: 3.9 g/dL (ref 3.5–5.0)
BILIRUBIN TOTAL: 0.6 mg/dL (ref 0.3–1.2)
BUN: 8 mg/dL (ref 6–20)
CO2: 22 mmol/L (ref 22–32)
Calcium: 8.8 mg/dL — ABNORMAL LOW (ref 8.9–10.3)
Chloride: 110 mmol/L (ref 101–111)
Creatinine, Ser: 0.59 mg/dL (ref 0.44–1.00)
GFR calc non Af Amer: >60 mL/min (ref >60)
Glucose, Bld: 134 mg/dL — ABNORMAL HIGH (ref 65–99)
Potassium: 3.4 mmol/L — ABNORMAL LOW (ref 3.5–5.1)
SODIUM: 140 mmol/L (ref 135–145)
Total Protein: 6.4 g/dL — ABNORMAL LOW (ref 6.5–8.1)

## 2017-10-05 LAB — I-STAT BETA HCG BLOOD, ED (MC, WL, AP ONLY): I-stat hCG, quantitative: <5 m[IU]/mL (ref <5)

## 2017-10-05 LAB — LIPASE, BLOOD: Lipase: 20 U/L (ref 11–51)

## 2017-10-05 MED ORDER — OMEPRAZOLE 20 MG PO CPDR: 20.0000 mg | DELAYED_RELEASE_CAPSULE | Freq: Every day | ORAL | 0 refills | Status: DC

## 2017-10-05 MED ORDER — GI COCKTAIL ~~LOC~~
30.0000 mL | Freq: Once | ORAL | Status: AC
  Administered 2017-10-05: 30 mL via ORAL
  Filled 2017-10-05: qty 30

## 2017-10-05 MED ORDER — FAMOTIDINE IN NACL 20-0.9 MG/50ML-% IV SOLN
20.0000 mg | Freq: Once | INTRAVENOUS | Status: AC
  Administered 2017-10-05: 20 mg via INTRAVENOUS
  Filled 2017-10-05: qty 50

## 2017-10-05 MED ORDER — SODIUM CHLORIDE 0.9 % IV BOLUS (SEPSIS)
1000.0000 mL | Freq: Once | INTRAVENOUS | Status: AC
  Administered 2017-10-05: 1000 mL via INTRAVENOUS
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
## 2017-10-05 NOTE — ED Triage Notes (Signed)
Pt states she ate earlier today. Pt now has centralized abdominal pain with diarrhea X 1. No nausea vomiting.

## 2017-10-05 NOTE — Discharge Instructions (Signed)
Thank you for allowing us to care for you  The cause of your abdominal pain is unclear at this time. It is not uncommon to have abdominal pain without a clear cause. Your labortory studies did not show any significant abnormalities, which is reassuring.  Your are being started on an anti acid medication as inappropriate acid production can be a common cause of abdominal pain.   Please follow up with your primary care provider about your visit and for evaluation for continued need for the new medication.   Return to the ED if you experience a return of severe symptoms.

## 2017-10-07 ENCOUNTER — Ambulatory Visit: Payer: 59 | Admitting: Physical Therapy

## 2017-10-09 ENCOUNTER — Ambulatory Visit: Payer: 59 | Admitting: Physical Therapy

## 2017-10-10 ENCOUNTER — Other Ambulatory Visit: Payer: Self-pay

## 2017-10-10 ENCOUNTER — Encounter: Payer: Self-pay | Admitting: Family Medicine

## 2017-10-10 ENCOUNTER — Ambulatory Visit (INDEPENDENT_AMBULATORY_CARE_PROVIDER_SITE_OTHER): Payer: 59 | Admitting: Family Medicine

## 2017-10-10 DIAGNOSIS — K219 Gastro-esophageal reflux disease without esophagitis: Secondary | ICD-10-CM

## 2017-10-10 MED ORDER — OMEPRAZOLE 20 MG PO CPDR: 20.0000 mg | DELAYED_RELEASE_CAPSULE | Freq: Every day | ORAL | 3 refills | Status: DC

## 2017-10-10 MED ORDER — BACLOFEN 10 MG PO TABS: 5.0000 mg | ORAL_TABLET | Freq: Three times a day (TID) | ORAL | 1 refills | Status: DC

## 2017-10-10 NOTE — Progress Notes (Signed)
   Subjective:    Patient ID: Kylie HausenJada M Longie, female    DOB: 10/17/1996, 21 y.o.   MRN: [id number]  HPI ER f/u- pt was seen in ER 11/11 for epigastric pain and tx'd for presumed reflux.  She had sxs relief w/ GI cocktail and was sent home on PPI.  Prior to arrival in ER had pain that lasted ~90 minutes like 'being squeezed all over'.  Pain occurred after eating- fries and crab cake sandwich.  No pain since d/c.  Taking Omeprazole daily.  Not eating regularly.   Review of Systems For ROS see HPI     Objective:   Physical Exam  Constitutional: She is oriented to person, place, and time. She appears well-developed and well-nourished. No distress.  Abdominal: Soft. Bowel sounds are normal. She exhibits no distension. There is no tenderness. There is no rebound and no guarding.  Neurological: She is alert and oriented to person, place, and time.  Skin: Skin is warm and dry. No rash noted. No erythema.  Psychiatric: She has a normal mood and affect. Her behavior is normal. Thought content normal.  Vitals reviewed.         Assessment & Plan:

## 2017-10-10 NOTE — Progress Notes (Signed)
Pre visit review using our clinic review tool, if applicable. No additional management support is needed unless otherwise documented below in the visit note. 

## 2017-10-10 NOTE — Patient Instructions (Signed)
Follow up as needed or as scheduled Continue the Omeprazole once daily Try and eat more regularly- small but frequent meals/snacks Call with any questions or concerns GOOD LUCK WITH SCHOOL! Happy Thanksgiving!!!

## 2017-10-10 NOTE — Assessment & Plan Note (Signed)
New.  Pt is asymptomatic since starting the Omeprazole.  Reviewed need to eat regularly to avoid acid sitting in an empty stomach.  Pt expressed understanding of this.  Refill provided on Omeprazole.  Reviewed dietary and lifestyle modifications that will improve sxs.

## 2017-10-13 ENCOUNTER — Ambulatory Visit: Payer: 59 | Admitting: Physical Therapy

## 2017-10-15 ENCOUNTER — Ambulatory Visit: Payer: 59 | Admitting: Physical Therapy

## 2017-10-15 DIAGNOSIS — R2689 Other abnormalities of gait and mobility: Secondary | ICD-10-CM

## 2017-10-15 DIAGNOSIS — M6281 Muscle weakness (generalized): Secondary | ICD-10-CM

## 2017-10-15 DIAGNOSIS — R2681 Unsteadiness on feet: Secondary | ICD-10-CM | POA: Diagnosis not present

## 2017-10-15 DIAGNOSIS — M6289 Other specified disorders of muscle: Secondary | ICD-10-CM
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
## 2017-10-15 NOTE — Patient Instructions (Addendum)
Walking Program:  Begin walking for exercise for 6-10 minutes, 1 times/day, most days/week.   Progress your walking program by adding1-2 minutes to your routine each week, as tolerated. Be sure to wear good walking shoes, walk in a safe environment and only progress to your tolerance.      ++++Push off with the balls of your feet.Heel Raises    Stand with support. Tighten pelvic floor and hold. With knees straight, raise heels off ground. Hold _3__ seconds.  Repeat _10__ times. Do 1-2___ times a day.  +++Progress to one foot as able 10x on each  Copyright  VHI. All rights reserved.

## 2017-10-20 ENCOUNTER — Ambulatory Visit: Payer: 59 | Admitting: Physical Therapy

## 2017-10-20 ENCOUNTER — Encounter: Payer: Self-pay | Admitting: Physical Therapy

## 2017-10-20 DIAGNOSIS — M6289 Other specified disorders of muscle: Secondary | ICD-10-CM

## 2017-10-20 DIAGNOSIS — R2689 Other abnormalities of gait and mobility: Secondary | ICD-10-CM

## 2017-10-20 DIAGNOSIS — M6281 Muscle weakness (generalized): Secondary | ICD-10-CM

## 2017-10-20 DIAGNOSIS — R2681 Unsteadiness on feet: Secondary | ICD-10-CM | POA: Diagnosis not present
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
## 2017-10-23 ENCOUNTER — Ambulatory Visit: Payer: 59 | Admitting: Physical Therapy

## 2017-10-23 ENCOUNTER — Encounter: Payer: Self-pay | Admitting: Physical Therapy

## 2017-10-23 DIAGNOSIS — M6281 Muscle weakness (generalized): Secondary | ICD-10-CM

## 2017-10-23 DIAGNOSIS — R2681 Unsteadiness on feet: Secondary | ICD-10-CM

## 2017-10-23 DIAGNOSIS — M6289 Other specified disorders of muscle: Secondary | ICD-10-CM

## 2017-10-23 DIAGNOSIS — R2689 Other abnormalities of gait and mobility: Secondary | ICD-10-CM
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
## 2017-10-27 ENCOUNTER — Other Ambulatory Visit: Payer: Self-pay | Admitting: Family Medicine

## 2017-10-27 ENCOUNTER — Ambulatory Visit: Payer: 59 | Attending: Physical Medicine and Rehabilitation | Admitting: Physical Therapy

## 2017-10-27 ENCOUNTER — Encounter: Payer: Self-pay | Admitting: Physical Therapy

## 2017-10-27 DIAGNOSIS — R293 Abnormal posture: Secondary | ICD-10-CM | POA: Insufficient documentation

## 2017-10-27 DIAGNOSIS — R2689 Other abnormalities of gait and mobility: Secondary | ICD-10-CM | POA: Diagnosis present

## 2017-10-27 DIAGNOSIS — M25611 Stiffness of right shoulder, not elsewhere classified: Secondary | ICD-10-CM | POA: Insufficient documentation

## 2017-10-27 DIAGNOSIS — R2681 Unsteadiness on feet: Secondary | ICD-10-CM

## 2017-10-27 DIAGNOSIS — R41844 Frontal lobe and executive function deficit: Secondary | ICD-10-CM | POA: Insufficient documentation

## 2017-10-27 DIAGNOSIS — M6289 Other specified disorders of muscle: Secondary | ICD-10-CM

## 2017-10-27 DIAGNOSIS — R278 Other lack of coordination: Secondary | ICD-10-CM | POA: Diagnosis present

## 2017-10-27 DIAGNOSIS — M6281 Muscle weakness (generalized): Secondary | ICD-10-CM | POA: Insufficient documentation

## 2017-10-27 DIAGNOSIS — G8252 Quadriplegia, C1-C4 incomplete: Secondary | ICD-10-CM | POA: Insufficient documentation
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
## 2017-10-30 ENCOUNTER — Ambulatory Visit: Payer: 59 | Admitting: Physical Therapy

## 2017-11-03 ENCOUNTER — Encounter: Payer: 59 | Admitting: Family Medicine

## 2017-11-04 ENCOUNTER — Ambulatory Visit: Payer: 59 | Admitting: Physical Therapy

## 2017-11-06 ENCOUNTER — Ambulatory Visit: Payer: 59 | Admitting: Physical Therapy

## 2017-11-06 ENCOUNTER — Encounter: Payer: Self-pay | Admitting: Physical Therapy

## 2017-11-06 DIAGNOSIS — R2681 Unsteadiness on feet: Secondary | ICD-10-CM

## 2017-11-06 DIAGNOSIS — G8252 Quadriplegia, C1-C4 incomplete: Secondary | ICD-10-CM

## 2017-11-06 DIAGNOSIS — M6281 Muscle weakness (generalized): Secondary | ICD-10-CM
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
## 2017-11-10 ENCOUNTER — Ambulatory Visit: Payer: 59 | Admitting: Neurology

## 2017-11-10 ENCOUNTER — Encounter: Payer: Self-pay | Admitting: Physical Medicine & Rehabilitation

## 2017-11-10 ENCOUNTER — Encounter: Payer: Self-pay | Admitting: Occupational Therapy

## 2017-11-10 ENCOUNTER — Ambulatory Visit: Payer: 59 | Admitting: Occupational Therapy

## 2017-11-10 ENCOUNTER — Encounter: Payer: 59 | Attending: Physical Medicine & Rehabilitation | Admitting: Physical Medicine & Rehabilitation

## 2017-11-10 ENCOUNTER — Encounter: Payer: Self-pay | Admitting: Neurology

## 2017-11-10 VITALS — BP 106/77 | HR 95

## 2017-11-10 VITALS — BP 105/79 | HR 72 | Ht 69.0 in | Wt 128.2 lb

## 2017-11-10 DIAGNOSIS — Z87820 Personal history of traumatic brain injury: Secondary | ICD-10-CM

## 2017-11-10 DIAGNOSIS — R2681 Unsteadiness on feet: Secondary | ICD-10-CM

## 2017-11-10 DIAGNOSIS — R293 Abnormal posture: Secondary | ICD-10-CM

## 2017-11-10 DIAGNOSIS — R278 Other lack of coordination: Secondary | ICD-10-CM

## 2017-11-10 DIAGNOSIS — S12201D Unspecified nondisplaced fracture of third cervical vertebra, subsequent encounter for fracture with routine healing: Secondary | ICD-10-CM | POA: Diagnosis not present

## 2017-11-10 DIAGNOSIS — S14109S Unspecified injury at unspecified level of cervical spinal cord, sequela: Secondary | ICD-10-CM

## 2017-11-10 DIAGNOSIS — R269 Unspecified abnormalities of gait and mobility: Secondary | ICD-10-CM | POA: Diagnosis not present

## 2017-11-10 DIAGNOSIS — G8252 Quadriplegia, C1-C4 incomplete: Secondary | ICD-10-CM

## 2017-11-10 DIAGNOSIS — I6501 Occlusion and stenosis of right vertebral artery: Secondary | ICD-10-CM | POA: Diagnosis not present

## 2017-11-10 DIAGNOSIS — S12200A Unspecified displaced fracture of third cervical vertebra, initial encounter for closed fracture: Secondary | ICD-10-CM | POA: Insufficient documentation

## 2017-11-10 DIAGNOSIS — R41844 Frontal lobe and executive function deficit: Secondary | ICD-10-CM

## 2017-11-10 DIAGNOSIS — F4320 Adjustment disorder, unspecified: Secondary | ICD-10-CM | POA: Insufficient documentation

## 2017-11-10 DIAGNOSIS — M6281 Muscle weakness (generalized): Secondary | ICD-10-CM

## 2017-11-10 DIAGNOSIS — M25611 Stiffness of right shoulder, not elsewhere classified: Secondary | ICD-10-CM

## 2017-11-10 NOTE — Progress Notes (Signed)
Subjective:    Patient ID: Kylie HausenJada M Osborn, female    DOB: 01/21/1996, 21 y.o.   MRN: [id number]  HPI   Mel AlmondJada is here in follow up of her TBI and SCI.  We made a referral for neuropsychology testing at last visit but apparently when her office call to schedule the patient did not call back.  Not clear what happened with that.  She still is planning on taking a class this spring which is introduction to nursing.  She might take a psychology class as well.  She is been helping siblings with school and feels that her cognition is generally improving.  Her gait continues to improve as well.  She is working hard with therapies on her mechanics.  Left hip pain is improved quite a bit.  She is exercising daily on her own as well.  She has noticed occasional blood in her stool.  She also reports being constipated.  She is taking Colace 100 mg twice daily now.  She is not eating a whole lot and does not have a lot of appetite in general.  She is trying to drink more fluids.  Denies frank loss of smell or taste.  Pain Inventory Average Pain 3 Pain Right Now 0 My pain is sharp and aching  In the last 24 hours, has pain interfered with the following? General activity 0 Relation with others 0 Enjoyment of life 0 What TIME of day is your pain at its worst? . Sleep (in general) Fair  Pain is worse with: . Pain improves with: therapy/exercise Relief from Meds: 0  Mobility walk without assistance ability to climb steps?  yes  Function I need assistance with the following:  meal prep and household duties  Neuro/Psych weakness spasms dizziness  Prior Studies Any changes since last visit?  no  Physicians involved in your care Any changes since last visit?  no   Family History  Problem Relation Age of Onset  . Hypertension Mother   . Liver disease Father   . Diabetes Maternal Grandmother   . Hypertension Maternal Grandfather    Social History   Socioeconomic History  . Marital  status: Single    Spouse name: Not on file  . Number of children: 0  . Years of education: college student  . Highest education level: Not on file  Social Needs  . Financial resource strain: Not on file  . Food insecurity - worry: Not on file  . Food insecurity - inability: Not on file  . Transportation needs - medical: Not on file  . Transportation needs - non-medical: Not on file  Occupational History  . Not on file  Tobacco Use  . Smoking status: Never Smoker  . Smokeless tobacco: Never Used  Substance and Sexual Activity  . Alcohol use: No  . Drug use: No  . Sexual activity: Not on file  Other Topics Concern  . Not on file  Social History Narrative   Lives at home with parents.   1 cup caffeine per day.   Ambidextrous.   Past Surgical History:  Procedure Laterality Date  . ESOPHAGOGASTRODUODENOSCOPY N/A 04/16/2017   Procedure: ESOPHAGOGASTRODUODENOSCOPY (EGD);  Surgeon: [name], Segreto, MD;  [facility];  Service: General;  Laterality: N/A;  . LACERATION REPAIR N/A 04/08/2017   Procedure: SCALP LACERATION REPAIR;  Surgeon: [name], [name], DMD;  [facility] OR;  Service: Oral Surgery;  Laterality: N/A;  . ORIF ULNAR FRACTURE Left 04/08/2017   Procedure: OPEN REDUCTION INTERNAL  FIXATION (ORIF) ULNAR FRACTURE and RADIAL FRACTURE;  Surgeon: [name], MD;  Location: [facility] OR;  Service: Orthopedics;  Laterality: Left;  . PEG PLACEMENT N/A 04/16/2017   Procedure: PERCUTANEOUS ENDOSCOPIC GASTROSTOMY (PEG) PLACEMENT;  Surgeon: Jimmye Norman, MD;  Location: [facility] ENDOSCOPY;  Service: General;  Laterality: N/A;  . PERCUTANEOUS TRACHEOSTOMY N/A 04/16/2017   Procedure: PERCUTANEOUS TRACHEOSTOMY;  Surgeon: Jimmye Norman, MD;  [facility] OR;  Service: General;  Laterality: N/A;  . POSTERIOR CERVICAL FUSION/FORAMINOTOMY N/A 04/06/2017   Procedure: C2- C4 LAMINECTOMY FOR DECOMPRESSION, OPEN REDUCTION OF DISPLACED FRACTURE , C2- C5 POSTERIOR FIXATION FUSION;  Surgeon: [name], [name], MD;  [facility] OR;  Service: Neurosurgery;  Laterality: N/A;   Past Medical History:  Diagnosis Date  . Strep pharyngitis 10/22/2015  . TBI (traumatic brain injury) ([facility]) 04/06/2017   There were no vitals taken for this visit.  Opioid Risk Score:   Fall Risk Score:  `1  Depression screen PHQ 2/9  Depression screen [facility] 2/9 10/10/2017 09/11/2017 08/27/2017 07/21/2017 02/01/2016  Decreased Interest 0 0 0 0 0  Down, Depressed, Hopeless 0 0 0 0 0  PHQ - 2 Score 0 0 0 0 0  Altered sleeping 0 0 0 0 -  Tired, decreased energy 0 0 1 0 -  Change in appetite 0 0 0 0 -  Feeling bad or failure about yourself  0 0 0 0 -  Trouble concentrating 0 0 0 0 -  Moving slowly or fidgety/restless 0 0 0 0 -  Suicidal thoughts 0 0 - 0 -  PHQ-9 Score 0 0 1 0 -  Difficult doing work/chores Not difficult at all Not difficult at all - Not difficult at all -     Review of Systems  Constitutional: Positive for appetite change and unexpected weight change.  HENT: Negative.   Eyes: Negative.   Respiratory: Positive for shortness of breath.   Cardiovascular: Negative.   Gastrointestinal: Negative.   Endocrine: Negative.   Genitourinary: Negative.   Musculoskeletal: Negative.   Skin: Negative.   Allergic/Immunologic: Negative.   Neurological: Negative.   Hematological: Negative.   Psychiatric/Behavioral: Negative.   All other systems reviewed and are negative.      Objective:   Physical Exam  General: Alert and oriented x 3, No apparent distress HEENT:perrl Neck:Supple without JVD or lymphadenopathy Heart:  Regular rate  Chest:  Normal effort Abdomen:Soft, non-tender, non-distended, bowel sounds positive. Extremities:No clubbing, cyanosis, or edema. Pulses are 2+ Skin:Clean and intact without signs of breakdown. Scars along right hand/wrist. Neuro:Pt is A/O x 3.  Distracted at times.  Needs cues to stay on task.  Did follow all simple commands without difficulty.  Reasonable  insight and awareness.Cranial nerves 2-12 are intact. Sensory exam is normal. Reflexes remain 3+ RUE and RLE. Motor 4/5 RUE prox to distal. RLE: 4 to 4+/5 prox to distal. Nearly 5/5 LUE and LLE. Gait mechanics improved quite a bit. Favoring the left hip minimally during gait.  She walks much more fluidly particularly when she gets going a bit.. . Musculoskeletal:  Left hip less tender.  She minimally favors the left hip.  Transfers and gait.  Psych:  Patient engages and is more animated..     Assessment & Plan:   1. C3 spinal cord injury, incomplete, suffered 04/06/17.  2. Mild TBI with ongoing higher level cognitive deficits 3. Adjustment reaction 4. Left hip pain. Likely greater trochanteric bursitis.  Improved along with her gait mechanics 5.  Neurogenic  bowel    Plan: 1.  Reviewed constipation.  Needs to increase fluid intake as well as intake of vegetables and fruits.  She really needs to increase her intake as a whole.  Additionally we talked about the use of further softeners and laxative.  She may want to try Senokot-S 2 tablets at bedtime.  She could increase to twice daily if needed.  Consider MiraLAX if that fails.  I suspect some of her decreased appetite is related to altered taste and smell from her brain injury. 2. Continue with outpt therapies to address gait, FMC, higher level balance and cognition. She is making continued progress in this area and looks much better today. 3.   Made another referral to neuro-psych testing with Dr. Kieth Brightlyodenbough.    Fortunately we were able to get her into see Dr. Kieth Brightlyodenbough this week so we can start the process of assessing her cognition.  She starts classes apparently on January 8.  I reviewed concerns regarding her cognition with the patient and her mother today.   4.  Sleep generally improving 5. Continue baclofen low dose for spasms.  6. Trial of heat, ice ,voltaren gel and stretches per PT for left greater Troch bursitis. Normal  gait mechanics will help also 7.  Follow up in about  2 months. 15 minutes of face to face patient care time were spent during this visit. All questions were encouraged and answered.
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
## 2017-11-10 NOTE — Patient Instructions (Signed)
YOU NEED TO LOOK AT EATING TO MAINTAIN YOUR WEIGHT AND YOUR REGULAR BOWEL MOVEMENTS.   EAT ENOUGH FRUITS AND VEGETABLES PLENTY OF WATER   SENOKOT-S ---TWO TABS AT NIGHT, ----YOU CAN GO UP TO 4 PER DAY (DIVIDED IN TWO DOSES). ----IF THAT DOESN'T WORK , YOU CAN TRY MIRALAX.   PLEASE FEEL FREE TO CALL OUR OFFICE WITH ANY PROBLEMS OR QUESTIONS [phone])  HAVE A HAPPY HOLIDAYS!                     ^                  ^^                ^ ^ ^             ^ ^ ^ ^ ^           ^ ^ ^ ^ ^ ^ ^        ^ ^ ^ ^ [place]^ ^ [place]^ ^ [place]^      ^ [place]^ [place]^ ^ [place]^ ^ [place]^ ^ [place]^ ^ Mar[place] Kitchen^                Marland Kitchen^^^^                Marland Kitchen^^^^                Marland Kitchen^^^^

## 2017-11-10 NOTE — Patient Instructions (Signed)
Pt's mom videotaped HEP for overhead reach (bliateral in closed chain) in supine with 3 pound weight, scapular retraction in standing using dowel behind back and lifting away from body. Pt also given exercise to address ER with mom assisting for additional stretch.

## 2017-11-12 ENCOUNTER — Ambulatory Visit: Payer: 59 | Admitting: Occupational Therapy

## 2017-11-12 DIAGNOSIS — M6281 Muscle weakness (generalized): Secondary | ICD-10-CM

## 2017-11-12 DIAGNOSIS — M25611 Stiffness of right shoulder, not elsewhere classified: Secondary | ICD-10-CM

## 2017-11-12 DIAGNOSIS — R41844 Frontal lobe and executive function deficit: Secondary | ICD-10-CM

## 2017-11-12 DIAGNOSIS — R2681 Unsteadiness on feet: Secondary | ICD-10-CM | POA: Diagnosis not present

## 2017-11-12 DIAGNOSIS — R278 Other lack of coordination: Secondary | ICD-10-CM
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
## 2017-11-13 ENCOUNTER — Encounter: Payer: 59 | Admitting: Occupational Therapy

## 2017-11-13 ENCOUNTER — Encounter: Payer: Self-pay | Admitting: Psychology

## 2017-11-13 ENCOUNTER — Encounter (HOSPITAL_BASED_OUTPATIENT_CLINIC_OR_DEPARTMENT_OTHER): Payer: 59 | Admitting: Psychology

## 2017-11-13 DIAGNOSIS — S14109S Unspecified injury at unspecified level of cervical spinal cord, sequela: Secondary | ICD-10-CM

## 2017-11-13 DIAGNOSIS — Z87820 Personal history of traumatic brain injury: Secondary | ICD-10-CM | POA: Diagnosis not present
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
## 2017-11-20 ENCOUNTER — Ambulatory Visit: Payer: 59 | Admitting: Occupational Therapy

## 2017-11-20 ENCOUNTER — Encounter: Payer: Self-pay | Admitting: Occupational Therapy

## 2017-11-20 DIAGNOSIS — R278 Other lack of coordination: Secondary | ICD-10-CM

## 2017-11-20 DIAGNOSIS — M25611 Stiffness of right shoulder, not elsewhere classified: Secondary | ICD-10-CM

## 2017-11-20 DIAGNOSIS — M6281 Muscle weakness (generalized): Secondary | ICD-10-CM

## 2017-11-20 DIAGNOSIS — R2681 Unsteadiness on feet: Secondary | ICD-10-CM

## 2017-11-20 DIAGNOSIS — R41844 Frontal lobe and executive function deficit: Secondary | ICD-10-CM
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
## 2017-11-24 ENCOUNTER — Ambulatory Visit: Payer: 59 | Admitting: Occupational Therapy

## 2017-11-24 ENCOUNTER — Encounter: Payer: Self-pay | Admitting: Occupational Therapy

## 2017-11-24 DIAGNOSIS — G8252 Quadriplegia, C1-C4 incomplete: Secondary | ICD-10-CM

## 2017-11-24 DIAGNOSIS — R278 Other lack of coordination: Secondary | ICD-10-CM

## 2017-11-24 DIAGNOSIS — M6281 Muscle weakness (generalized): Secondary | ICD-10-CM

## 2017-11-24 DIAGNOSIS — R41844 Frontal lobe and executive function deficit: Secondary | ICD-10-CM

## 2017-11-24 DIAGNOSIS — R293 Abnormal posture: Secondary | ICD-10-CM

## 2017-11-24 DIAGNOSIS — R2681 Unsteadiness on feet: Secondary | ICD-10-CM

## 2017-11-24 DIAGNOSIS — M25611 Stiffness of right shoulder, not elsewhere classified: Secondary | ICD-10-CM
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
## 2017-11-24 NOTE — Patient Instructions (Signed)
Pt given ER in sidelying using water bottle for weight.

## 2017-11-27 ENCOUNTER — Ambulatory Visit: Payer: 59 | Attending: Physical Medicine and Rehabilitation | Admitting: Occupational Therapy

## 2017-11-27 DIAGNOSIS — M6281 Muscle weakness (generalized): Secondary | ICD-10-CM | POA: Insufficient documentation

## 2017-11-27 DIAGNOSIS — R41844 Frontal lobe and executive function deficit: Secondary | ICD-10-CM | POA: Insufficient documentation

## 2017-11-27 DIAGNOSIS — G8252 Quadriplegia, C1-C4 incomplete: Secondary | ICD-10-CM | POA: Insufficient documentation

## 2017-11-27 DIAGNOSIS — R293 Abnormal posture: Secondary | ICD-10-CM | POA: Diagnosis present

## 2017-11-27 DIAGNOSIS — R2681 Unsteadiness on feet: Secondary | ICD-10-CM | POA: Insufficient documentation

## 2017-11-27 DIAGNOSIS — R278 Other lack of coordination: Secondary | ICD-10-CM | POA: Diagnosis present

## 2017-11-27 DIAGNOSIS — M25611 Stiffness of right shoulder, not elsewhere classified: Secondary | ICD-10-CM | POA: Insufficient documentation
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
## 2017-12-02 ENCOUNTER — Ambulatory Visit: Payer: 59 | Admitting: Occupational Therapy

## 2017-12-02 ENCOUNTER — Encounter: Payer: Self-pay | Admitting: Occupational Therapy

## 2017-12-02 DIAGNOSIS — M25611 Stiffness of right shoulder, not elsewhere classified: Secondary | ICD-10-CM

## 2017-12-02 DIAGNOSIS — R41844 Frontal lobe and executive function deficit: Secondary | ICD-10-CM

## 2017-12-02 DIAGNOSIS — R2681 Unsteadiness on feet: Secondary | ICD-10-CM

## 2017-12-02 DIAGNOSIS — M6281 Muscle weakness (generalized): Secondary | ICD-10-CM | POA: Diagnosis not present

## 2017-12-02 DIAGNOSIS — R278 Other lack of coordination: Secondary | ICD-10-CM
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
## 2017-12-04 ENCOUNTER — Ambulatory Visit: Payer: 59 | Admitting: Occupational Therapy

## 2017-12-04 DIAGNOSIS — M6281 Muscle weakness (generalized): Secondary | ICD-10-CM | POA: Diagnosis not present

## 2017-12-04 DIAGNOSIS — R278 Other lack of coordination: Secondary | ICD-10-CM

## 2017-12-04 DIAGNOSIS — R41844 Frontal lobe and executive function deficit: Secondary | ICD-10-CM

## 2017-12-04 DIAGNOSIS — M25611 Stiffness of right shoulder, not elsewhere classified: Secondary | ICD-10-CM
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
## 2017-12-09 ENCOUNTER — Ambulatory Visit: Payer: 59 | Admitting: Occupational Therapy

## 2017-12-09 DIAGNOSIS — M6281 Muscle weakness (generalized): Secondary | ICD-10-CM | POA: Diagnosis not present

## 2017-12-09 DIAGNOSIS — R278 Other lack of coordination: Secondary | ICD-10-CM

## 2017-12-09 DIAGNOSIS — M25611 Stiffness of right shoulder, not elsewhere classified: Secondary | ICD-10-CM

## 2017-12-09 DIAGNOSIS — R41844 Frontal lobe and executive function deficit: Secondary | ICD-10-CM
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
## 2017-12-11 ENCOUNTER — Encounter: Payer: Self-pay | Admitting: Occupational Therapy

## 2017-12-11 ENCOUNTER — Ambulatory Visit: Payer: 59 | Admitting: Occupational Therapy

## 2017-12-11 DIAGNOSIS — M25611 Stiffness of right shoulder, not elsewhere classified: Secondary | ICD-10-CM

## 2017-12-11 DIAGNOSIS — R278 Other lack of coordination: Secondary | ICD-10-CM

## 2017-12-11 DIAGNOSIS — R41844 Frontal lobe and executive function deficit: Secondary | ICD-10-CM

## 2017-12-11 DIAGNOSIS — R2681 Unsteadiness on feet: Secondary | ICD-10-CM

## 2017-12-11 DIAGNOSIS — R293 Abnormal posture: Secondary | ICD-10-CM

## 2017-12-11 DIAGNOSIS — G8252 Quadriplegia, C1-C4 incomplete: Secondary | ICD-10-CM

## 2017-12-11 DIAGNOSIS — M6281 Muscle weakness (generalized): Secondary | ICD-10-CM
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
## 2017-12-16 ENCOUNTER — Ambulatory Visit: Payer: 59 | Admitting: Occupational Therapy

## 2017-12-16 DIAGNOSIS — M6281 Muscle weakness (generalized): Secondary | ICD-10-CM

## 2017-12-16 DIAGNOSIS — M25611 Stiffness of right shoulder, not elsewhere classified: Secondary | ICD-10-CM

## 2017-12-16 DIAGNOSIS — R41844 Frontal lobe and executive function deficit: Secondary | ICD-10-CM

## 2017-12-16 DIAGNOSIS — R278 Other lack of coordination: Secondary | ICD-10-CM
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
## 2017-12-19 ENCOUNTER — Ambulatory Visit: Payer: 59 | Admitting: Occupational Therapy

## 2017-12-19 DIAGNOSIS — M6281 Muscle weakness (generalized): Secondary | ICD-10-CM

## 2017-12-19 DIAGNOSIS — R278 Other lack of coordination: Secondary | ICD-10-CM

## 2017-12-19 DIAGNOSIS — M25611 Stiffness of right shoulder, not elsewhere classified: Secondary | ICD-10-CM

## 2017-12-19 DIAGNOSIS — R41844 Frontal lobe and executive function deficit: Secondary | ICD-10-CM
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
## 2017-12-23 ENCOUNTER — Ambulatory Visit: Payer: 59 | Admitting: Occupational Therapy

## 2017-12-23 DIAGNOSIS — M25611 Stiffness of right shoulder, not elsewhere classified: Secondary | ICD-10-CM

## 2017-12-23 DIAGNOSIS — M6281 Muscle weakness (generalized): Secondary | ICD-10-CM

## 2017-12-23 DIAGNOSIS — R278 Other lack of coordination: Secondary | ICD-10-CM

## 2017-12-23 DIAGNOSIS — R41844 Frontal lobe and executive function deficit: Secondary | ICD-10-CM
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
## 2017-12-25 ENCOUNTER — Encounter: Payer: Self-pay | Admitting: Occupational Therapy

## 2017-12-25 ENCOUNTER — Ambulatory Visit: Payer: 59 | Admitting: Occupational Therapy

## 2017-12-25 DIAGNOSIS — M25611 Stiffness of right shoulder, not elsewhere classified: Secondary | ICD-10-CM

## 2017-12-25 DIAGNOSIS — M6281 Muscle weakness (generalized): Secondary | ICD-10-CM | POA: Diagnosis not present

## 2017-12-25 DIAGNOSIS — R278 Other lack of coordination: Secondary | ICD-10-CM

## 2017-12-25 DIAGNOSIS — R293 Abnormal posture: Secondary | ICD-10-CM

## 2017-12-25 DIAGNOSIS — G8252 Quadriplegia, C1-C4 incomplete: Secondary | ICD-10-CM

## 2017-12-25 DIAGNOSIS — R2681 Unsteadiness on feet: Secondary | ICD-10-CM

## 2017-12-25 DIAGNOSIS — R41844 Frontal lobe and executive function deficit: Secondary | ICD-10-CM
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
## 2017-12-30 ENCOUNTER — Encounter: Payer: 59 | Admitting: Occupational Therapy

## 2017-12-31 ENCOUNTER — Telehealth: Payer: Self-pay | Admitting: Occupational Therapy

## 2017-12-31 ENCOUNTER — Other Ambulatory Visit: Payer: Self-pay | Admitting: Family Medicine

## 2017-12-31 DIAGNOSIS — S14109S Unspecified injury at unspecified level of cervical spinal cord, sequela: Secondary | ICD-10-CM

## 2017-12-31 NOTE — Telephone Encounter (Signed)
Dr. Riley KillSwartz, Kylie Osborn is receiving occupational therapy at our site. She reports continued difficulties walking longer distances and up hills now that she has returned to college classes. She requests a referral to PT. If you agree please place this order. Sincerely, Keene BreathKathryn Rine, OTR/L

## 2018-01-01 ENCOUNTER — Ambulatory Visit: Payer: 59 | Attending: Physical Medicine and Rehabilitation | Admitting: Occupational Therapy

## 2018-01-01 ENCOUNTER — Encounter: Payer: Self-pay | Admitting: Occupational Therapy

## 2018-01-01 DIAGNOSIS — G8252 Quadriplegia, C1-C4 incomplete: Secondary | ICD-10-CM | POA: Diagnosis present

## 2018-01-01 DIAGNOSIS — M6281 Muscle weakness (generalized): Secondary | ICD-10-CM | POA: Insufficient documentation

## 2018-01-01 DIAGNOSIS — M25611 Stiffness of right shoulder, not elsewhere classified: Secondary | ICD-10-CM | POA: Diagnosis present

## 2018-01-01 DIAGNOSIS — R2681 Unsteadiness on feet: Secondary | ICD-10-CM | POA: Insufficient documentation

## 2018-01-01 DIAGNOSIS — R278 Other lack of coordination: Secondary | ICD-10-CM | POA: Insufficient documentation

## 2018-01-01 DIAGNOSIS — R293 Abnormal posture: Secondary | ICD-10-CM | POA: Insufficient documentation

## 2018-01-01 DIAGNOSIS — R41844 Frontal lobe and executive function deficit: Secondary | ICD-10-CM | POA: Diagnosis present

## 2018-01-01 NOTE — Patient Instructions (Signed)
Local Driver Evaluation Programs: ° °Comprehensive Evaluation: includes clinical and in vehicle behind the wheel testing by OCCUPATIONAL THERAPIST. Programs have varying levels of adaptive controls available for trial.  ° °[facility] °[address] °[place], [place]  [address] °[phone] or [phone] °http://www.driver-rehab.com °Evaluator:  Cyndee Crompton, OT/CDRS/CDI/SCDCM/Low Vision Certification ° °[facility] °[address] °[place], [place] [address] °[phone] °https://www.[facility].org/home/services/rehabilitation.aspx °Evaluators:  Shannon Sheek, OT and Jill Tucker, OT ° °[facility] [place] [facility] (ONLY SERVES VETERANS!!) °[facility] °[address] °[place], [place]  [address] °[phone] °http://www.[place].va.gov/services/Physical_Medicine_Rehabilitation_Services.asp °Evaluators:  Eric Andrews, KT; Heidi Harris, KT;  Gary Whitaker, KT (KT=kiniesotherapist) ° ° °Clinical evaluations only:  Includes clinical testing, refers to other programs or local certified driving instructor for behind the wheel testing. ° °[facility] (outpatient Rehab) °[facility] °[address] °[place], [place] [address] °[phone] for scheduling °http://www.[facility]edu/Outpatient-Rehabilitation/Neurorehabilitation-Therapy.htm °Evaluators:  Kelly Lambeth, OT; Kate Phillips, OT ° °Other area clinical evaluators available upon request including [facility], [facility] and [facility]. ° ° °    Resource List °What is a Driver Evaluation: °Your Road Ahead - A Guide to Comprehensive Driving Evaluations °http://www.thehartford.com/resources/mature-market-excellence/publications-on-aging ° °Association for [employer] - Disability and Driving Fact Sheets °http://www.aded.net/?page=510 ° °Driving after a Brain  Injury: °[employer] °http://www.biausa.org/tbims-abstracts/if-there-is-an-effective-way-to-determine-if-someone-is-ready-to-drive-after-tbi?A=SearchResult&SearchID=9495675&ObjectID=2758842&ObjectType=35 ° °Driving with Adaptive Equipment: °[facility] °http://www.driver-rehab.com/adaptive-equipment ° °[employer] °http://www.nmeda.com/ ° ° ° ° ° ° °  °
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
## 2018-01-06 ENCOUNTER — Ambulatory Visit: Payer: 59 | Admitting: Occupational Therapy

## 2018-01-06 DIAGNOSIS — M6281 Muscle weakness (generalized): Secondary | ICD-10-CM | POA: Diagnosis not present

## 2018-01-06 DIAGNOSIS — R41844 Frontal lobe and executive function deficit: Secondary | ICD-10-CM

## 2018-01-06 DIAGNOSIS — R278 Other lack of coordination: Secondary | ICD-10-CM

## 2018-01-06 DIAGNOSIS — M25611 Stiffness of right shoulder, not elsewhere classified: Secondary | ICD-10-CM

## 2018-01-06 NOTE — Patient Instructions (Signed)
The tape on your shoulder blade is designed to help with positioning, you can wear it for 3-5 days. Get it wet and remove it if it bothers you or causes pain/ irritation.
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
## 2018-01-08 ENCOUNTER — Ambulatory Visit: Payer: 59 | Admitting: Occupational Therapy

## 2018-01-09 ENCOUNTER — Ambulatory Visit: Payer: 59 | Admitting: Occupational Therapy

## 2018-01-09 DIAGNOSIS — M6281 Muscle weakness (generalized): Secondary | ICD-10-CM

## 2018-01-09 DIAGNOSIS — R41844 Frontal lobe and executive function deficit: Secondary | ICD-10-CM

## 2018-01-09 DIAGNOSIS — R278 Other lack of coordination: Secondary | ICD-10-CM

## 2018-01-09 DIAGNOSIS — M25611 Stiffness of right shoulder, not elsewhere classified: Secondary | ICD-10-CM
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
## 2018-01-12 ENCOUNTER — Encounter: Payer: 59 | Attending: Physical Medicine & Rehabilitation | Admitting: Physical Medicine & Rehabilitation

## 2018-01-12 ENCOUNTER — Encounter: Payer: Self-pay | Admitting: Physical Medicine & Rehabilitation

## 2018-01-12 VITALS — BP 102/68 | HR 60

## 2018-01-12 DIAGNOSIS — S14109S Unspecified injury at unspecified level of cervical spinal cord, sequela: Secondary | ICD-10-CM | POA: Insufficient documentation

## 2018-01-12 DIAGNOSIS — G8929 Other chronic pain: Secondary | ICD-10-CM | POA: Diagnosis not present

## 2018-01-12 DIAGNOSIS — M25511 Pain in right shoulder: Secondary | ICD-10-CM

## 2018-01-12 DIAGNOSIS — Z87820 Personal history of traumatic brain injury: Secondary | ICD-10-CM

## 2018-01-12 DIAGNOSIS — F4320 Adjustment disorder, unspecified: Secondary | ICD-10-CM | POA: Diagnosis not present

## 2018-01-12 NOTE — Progress Notes (Signed)
Subjective:    Patient ID: Kylie HausenJada M Osborn, female    DOB: 03/22/1996, 21 y.o.   MRN: [id number]  HPI   Kylie AlmondJada is here in follow-up of her spinal cord injury and traumatic brain injury.  She has been doing fairly well since I last saw her.  She had her initial neuropsychological evaluation in December.  Her formal testing starts tomorrow.  She has resumed taking classes however. She is taking [employer],  Intro to Nursing thus far. She has had a month of classes thus far.  She feels that she is doing well in her classes so far.  She reports that her concentration is not a problem in class but is a problem at home when she tries to study.  She has difficulty focusing with distractions at home and does not typically study in a quiet room.  She is also noticing ongoing pain in her right shoulder and does not always engage the shoulder due to discomfort.  She feels that the shoulder has not progressed as much as she would hope.  Therapy still is working on the shoulder and patient does report some increase in strength.  She is using Voltaren gel at times and may be occasional Tylenol.  She is not using anything else.  Pain Inventory Average Pain 5 Pain Right Now 5 My pain is aching  In the last 24 hours, has pain interfered with the following? General activity 4 Relation with others 5 Enjoyment of life 1 What TIME of day is your pain at its worst? all Sleep (in general) NA  Pain is worse with: sitting and standing Pain improves with: therapy/exercise Relief from Meds: 3  Mobility walk without assistance ability to climb steps?  yes do you drive?  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  no  Physicians involved in your care Any changes since last visit?  no   Family History  Problem Relation Age of Onset  . Hypertension Mother   . Liver disease Father   . Diabetes Maternal Grandmother   . Hypertension Maternal  Grandfather    Social History   Socioeconomic History  . Marital status: Single    Spouse name: Not on file  . Number of children: 0  . Years of education: college student  . Highest education level: Not on file  Social Needs  . Financial resource strain: Not on file  . Food insecurity - worry: Not on file  . Food insecurity - inability: Not on file  . Transportation needs - medical: Not on file  . Transportation needs - non-medical: Not on file  Occupational History  . Not on file  Tobacco Use  . Smoking status: Never Smoker  . Smokeless tobacco: Never Used  Substance and Sexual Activity  . Alcohol use: No  . Drug use: No  . Sexual activity: Not on file  Other Topics Concern  . Not on file  Social History Narrative   Lives at home with parents.   1 cup caffeine per day.   Ambidextrous.   Past Surgical History:  Procedure Laterality Date  . ESOPHAGOGASTRODUODENOSCOPY N/A 04/16/2017   Procedure: ESOPHAGOGASTRODUODENOSCOPY (EGD);  Surgeon: [name], Biasi, MD;  [facility];  Service: General;  Laterality: N/A;  . LACERATION REPAIR N/A 04/08/2017   Procedure: SCALP LACERATION REPAIR;  Surgeon: [name], [name], DMD;  Location: [facility] OR;  Service: Oral Surgery;  Laterality: N/A;  . ORIF  ULNAR FRACTURE Left 04/08/2017   Procedure: OPEN REDUCTION INTERNAL FIXATION (ORIF) ULNAR FRACTURE and RADIAL FRACTURE;  Surgeon: [name], MD;  Location: [facility] OR;  Service: Orthopedics;  Laterality: Left;  . PEG PLACEMENT N/A 04/16/2017   Procedure: PERCUTANEOUS ENDOSCOPIC GASTROSTOMY (PEG) PLACEMENT;  Surgeon: Jimmye Norman, MD;  Location: [facility] ENDOSCOPY;  Service: General;  Laterality: N/A;  . PERCUTANEOUS TRACHEOSTOMY N/A 04/16/2017   Procedure: PERCUTANEOUS TRACHEOSTOMY;  Surgeon: Jimmye Norman, MD;  Location: [facility] OR;  Service: General;  Laterality: N/A;  . POSTERIOR CERVICAL FUSION/FORAMINOTOMY N/A 04/06/2017   Procedure: C2- C4 LAMINECTOMY FOR DECOMPRESSION, OPEN REDUCTION OF DISPLACED  FRACTURE , C2- C5 POSTERIOR FIXATION FUSION;  Surgeon: [name], [name], MD;  Location: [facility] OR;  Service: Neurosurgery;  Laterality: N/A;   Past Medical History:  Diagnosis Date  . Strep pharyngitis 10/22/2015  . TBI (traumatic brain injury) (HCC) 04/06/2017   There were no vitals taken for this visit.  Opioid Risk Score:   Fall Risk Score:  `1  Depression screen PHQ 2/9  Depression screen [facility] 2/9 10/10/2017 09/11/2017 08/27/2017 07/21/2017 02/01/2016  Decreased Interest 0 0 0 0 0  Down, Depressed, Hopeless 0 0 0 0 0  PHQ - 2 Score 0 0 0 0 0  Altered sleeping 0 0 0 0 -  Tired, decreased energy 0 0 1 0 -  Change in appetite 0 0 0 0 -  Feeling bad or failure about yourself  0 0 0 0 -  Trouble concentrating 0 0 0 0 -  Moving slowly or fidgety/restless 0 0 0 0 -  Suicidal thoughts 0 0 - 0 -  PHQ-9 Score 0 0 1 0 -  Difficult doing work/chores Not difficult at all Not difficult at all - Not difficult at all -     Review of Systems  Constitutional: Positive for appetite change.  Respiratory: Positive for shortness of breath.   Neurological:       Spasms  All other systems reviewed and are negative.      Objective:   Physical Exam  General: Alert and oriented x 3, No apparent distress HEENT:perrl Neck:Supple without JVD or lymphadenopathy Heart:  Regular rate Chest:  Normal effort Abdomen:Soft, non-tender, non-distended, bowel sounds positive. Extremities:No clubbing, cyanosis, or edema. Pulses are 2+ Skin:Clean and intact without signs of breakdown. Scars along right hand/wrist. Neuro:Pt is A/O x 3.improved attention and focus. Did follow all simple commands without difficulty. Reasonable insight and awareness.Cranial nerves 2-12 are intact. Sensory exam is normal. Reflexes remain 3+ RUE and RLE. Motor 4/5 RUE prox to distal. RLE: 4 to 4+/5 prox to distal. Nearly 5/5 LUE and LLE.Gait mechanics improved quite a bit.  Gait shows improvement although she still  favors the right side a bit more.... Musculoskeletal:Minimal tenderness with range of motion of right shoulder.  She still is limited somewhat by weakness however in the right shoulder girdle area.  There is some posterior and lateral winging of the scapula noted and associated atrophy in the area. Marland Kitchen  Psych:Engages fairly well.  Still quiet     Assessment & Plan:   1. C3 spinal cord injury, incomplete, suffered 04/06/17.  2. Mild TBI with ongoing higher level cognitive deficits 3. Adjustment reaction 4. Left hip pain. Likely greater trochanteric bursitis.  Improved along with her gait mechanics 5.  Neurogenic bowel 6. Right shoulder  Pain.  Likely related to ongoing weakness of shoulder and shoulder girdle musculature.  I see no rotator cuff pathology or arthritic signs  on exam today.    Plan: 1.  Constipation/neurogenic bowel: water and vegetables.    -dietary intake needs to be increased in general 2.continue outpt therapies. OT driving eval? 3. Pt has neuropsych testing tomorrow.  Discussed ways to optimize focus and attention at home and in school.   4.  Sleep generally improving 5. Continue baclofen low dose for spasms.  6.ice, voltaren gel for shoulder. Recommended tylenol prn or occasional ibuprofen 200mg    -should further improve with strengthening of right shoulder 7.Follow up in about 4 months. 15 minutesof face to face patient care time were spent during this visit. All questions were encouraged and answered.

## 2018-01-12 NOTE — Patient Instructions (Signed)
CONTINUE TO WORK ON STRENGTHENING YOUR RIGHT SHOULDER AND UPPER BACK   TYLENOL 500MG  EVERY 4-6 HOURS AS NEEDED  CONTINUE VOLTAREN GEL, HEAT, ICE  CAN USE AN OCCASIONAL IBUPROFEN 200MG  Q12 AS NEEDED.  AVOID TAKING DAILY HOWEVER WHILE ON ASPIRIN.    LIMIT DISTRACTIONS WHEN YOU ARE IN CLASS AND CERTAINLY WHEN YOU STUDY AT HOME   PLEASE FEEL FREE TO CALL OUR OFFICE WITH ANY PROBLEMS OR QUESTIONS [phone])     E

## 2018-01-13 ENCOUNTER — Encounter: Payer: Self-pay | Admitting: Psychology

## 2018-01-13 ENCOUNTER — Encounter (HOSPITAL_BASED_OUTPATIENT_CLINIC_OR_DEPARTMENT_OTHER): Payer: 59 | Admitting: Psychology

## 2018-01-13 DIAGNOSIS — S14109S Unspecified injury at unspecified level of cervical spinal cord, sequela: Secondary | ICD-10-CM | POA: Diagnosis not present

## 2018-01-13 DIAGNOSIS — Z87820 Personal history of traumatic brain injury: Secondary | ICD-10-CM

## 2018-01-13 NOTE — Progress Notes (Signed)
Today administered the WAIS-IV and the WMS-IV.  The patient fully participated and valid assessment.  [facility]

## 2018-01-14 ENCOUNTER — Encounter: Payer: 59 | Admitting: Occupational Therapy

## 2018-01-15 ENCOUNTER — Ambulatory Visit: Payer: 59 | Admitting: Occupational Therapy

## 2018-01-15 DIAGNOSIS — R2681 Unsteadiness on feet: Secondary | ICD-10-CM

## 2018-01-15 DIAGNOSIS — M6281 Muscle weakness (generalized): Secondary | ICD-10-CM | POA: Diagnosis not present

## 2018-01-15 DIAGNOSIS — M25611 Stiffness of right shoulder, not elsewhere classified: Secondary | ICD-10-CM

## 2018-01-15 DIAGNOSIS — R41844 Frontal lobe and executive function deficit: Secondary | ICD-10-CM

## 2018-01-15 DIAGNOSIS — R293 Abnormal posture: Secondary | ICD-10-CM

## 2018-01-15 DIAGNOSIS — R278 Other lack of coordination: Secondary | ICD-10-CM
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
## 2018-01-16 NOTE — Telephone Encounter (Signed)
Order writen

## 2018-01-20 ENCOUNTER — Encounter: Payer: Self-pay | Admitting: Psychology

## 2018-01-20 ENCOUNTER — Encounter (HOSPITAL_BASED_OUTPATIENT_CLINIC_OR_DEPARTMENT_OTHER): Payer: 59 | Admitting: Psychology

## 2018-01-20 ENCOUNTER — Encounter: Payer: Self-pay | Admitting: Occupational Therapy

## 2018-01-20 ENCOUNTER — Ambulatory Visit: Payer: 59 | Admitting: Occupational Therapy

## 2018-01-20 DIAGNOSIS — R278 Other lack of coordination: Secondary | ICD-10-CM

## 2018-01-20 DIAGNOSIS — S14109S Unspecified injury at unspecified level of cervical spinal cord, sequela: Secondary | ICD-10-CM

## 2018-01-20 DIAGNOSIS — M25611 Stiffness of right shoulder, not elsewhere classified: Secondary | ICD-10-CM

## 2018-01-20 DIAGNOSIS — R2681 Unsteadiness on feet: Secondary | ICD-10-CM

## 2018-01-20 DIAGNOSIS — Z87820 Personal history of traumatic brain injury: Secondary | ICD-10-CM

## 2018-01-20 DIAGNOSIS — R293 Abnormal posture: Secondary | ICD-10-CM

## 2018-01-20 DIAGNOSIS — R41844 Frontal lobe and executive function deficit: Secondary | ICD-10-CM

## 2018-01-20 DIAGNOSIS — G8252 Quadriplegia, C1-C4 incomplete: Secondary | ICD-10-CM

## 2018-01-20 DIAGNOSIS — M6281 Muscle weakness (generalized): Secondary | ICD-10-CM
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
## 2018-01-22 ENCOUNTER — Ambulatory Visit: Payer: 59 | Admitting: Occupational Therapy

## 2018-01-22 DIAGNOSIS — M6281 Muscle weakness (generalized): Secondary | ICD-10-CM | POA: Diagnosis not present

## 2018-01-22 DIAGNOSIS — R278 Other lack of coordination: Secondary | ICD-10-CM

## 2018-01-22 DIAGNOSIS — R41844 Frontal lobe and executive function deficit: Secondary | ICD-10-CM

## 2018-01-22 DIAGNOSIS — M25611 Stiffness of right shoulder, not elsewhere classified: Secondary | ICD-10-CM
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
## 2018-01-26 ENCOUNTER — Encounter: Payer: 59 | Admitting: Occupational Therapy

## 2018-01-27 ENCOUNTER — Ambulatory Visit: Payer: 59 | Admitting: Psychology

## 2018-01-27 ENCOUNTER — Encounter: Payer: 59 | Admitting: Occupational Therapy

## 2018-03-02 ENCOUNTER — Other Ambulatory Visit: Payer: Self-pay | Admitting: Family Medicine

## 2018-03-17 ENCOUNTER — Encounter: Payer: 59 | Attending: Physical Medicine & Rehabilitation | Admitting: Psychology

## 2018-03-17 DIAGNOSIS — F4320 Adjustment disorder, unspecified: Secondary | ICD-10-CM | POA: Insufficient documentation

## 2018-03-17 DIAGNOSIS — S14109S Unspecified injury at unspecified level of cervical spinal cord, sequela: Secondary | ICD-10-CM | POA: Insufficient documentation

## 2018-04-06 ENCOUNTER — Other Ambulatory Visit: Payer: Self-pay | Admitting: Family Medicine

## 2018-04-10 ENCOUNTER — Encounter: Payer: Self-pay | Admitting: Family Medicine

## 2018-04-10 ENCOUNTER — Ambulatory Visit (INDEPENDENT_AMBULATORY_CARE_PROVIDER_SITE_OTHER): Payer: 59 | Admitting: Family Medicine

## 2018-04-10 ENCOUNTER — Other Ambulatory Visit: Payer: Self-pay

## 2018-04-10 VITALS — BP 110/70 | HR 76 | Temp 99.5°F | Resp 16 | Ht 69.0 in | Wt 119.2 lb

## 2018-04-10 DIAGNOSIS — R6889 Other general symptoms and signs: Secondary | ICD-10-CM | POA: Diagnosis not present

## 2018-04-10 DIAGNOSIS — R509 Fever, unspecified: Secondary | ICD-10-CM

## 2018-04-10 LAB — POCT RAPID STREP A (OFFICE): RAPID STREP A SCREEN: NEGATIVE

## 2018-04-10 LAB — POC INFLUENZA A&B (BINAX/QUICKVUE)
INFLUENZA A, POC: NEGATIVE
INFLUENZA B, POC: NEGATIVE

## 2018-04-10 MED ORDER — OSELTAMIVIR PHOSPHATE 75 MG PO CAPS: 75.0000 mg | ORAL_CAPSULE | Freq: Two times a day (BID) | ORAL | 0 refills | Status: DC

## 2018-04-10 NOTE — Progress Notes (Signed)
   Subjective:    Patient ID: Kylie Osborn, female    DOB: Dec 04, 1995, 22 y.o.   MRN: [id number]  HPI Fever-sxs started yesterday morning.  Tm 102.  No sore throat.  + HA.  No ear pain.  + body aches.  No sinus pain/pressure.  No N/V.  No diarrhea.  No known sick contacts but was in [place] at a concert.  No cough.  sxs are the same since onset yesterday.   Review of Systems For ROS see HPI     Objective:   Physical Exam  Constitutional: She appears well-developed and well-nourished. No distress.  Obviously not feeling well  HENT:  Head: Normocephalic and atraumatic.  Right Ear: Tympanic membrane normal.  Left Ear: Tympanic membrane normal.  Nose: Mucosal edema and rhinorrhea present. Right sinus exhibits no maxillary sinus tenderness and no frontal sinus tenderness. Left sinus exhibits no maxillary sinus tenderness and no frontal sinus tenderness.  Mouth/Throat: Uvula is midline and mucous membranes are normal. Posterior oropharyngeal erythema present. No oropharyngeal exudate.  Eyes: Pupils are equal, round, and reactive to light. Conjunctivae and EOM are normal.  Neck: Normal range of motion. Neck supple.  Cardiovascular: Normal rate, regular rhythm and normal heart sounds.  Pulmonary/Chest: Effort normal and breath sounds normal. No respiratory distress. She has no wheezes.  Lymphadenopathy:    She has no cervical adenopathy.  Skin: Skin is warm.  Vitals reviewed.         Assessment & Plan:  Fever- no obvious bacterial source of infxn.  Strep negative.  Flu negative but pt's sxs and PE consistent w/ flu or ILI.  Start Tamiflu.  Reviewed supportive care and red flags that should prompt return.  Pt expressed understanding and is in agreement w/ plan.

## 2018-04-10 NOTE — Patient Instructions (Signed)
Follow up as needed or as scheduled START the Tamiflu twice daily- take w/ food Drink plenty of fluids REST! Alternate tylenol/ibuprofen for pain and fever Call with any questions or concerns Hang in there!

## 2018-04-12 LAB — CULTURE, GROUP A STREP
MICRO NUMBER:: 90606035
SPECIMEN QUALITY: ADEQUATE

## 2018-05-11 ENCOUNTER — Ambulatory Visit: Payer: 59 | Admitting: Physical Medicine & Rehabilitation

## 2018-05-12 ENCOUNTER — Encounter: Payer: 59 | Admitting: Physical Medicine & Rehabilitation

## 2018-05-18 ENCOUNTER — Other Ambulatory Visit: Payer: Self-pay | Admitting: Family Medicine

## 2018-05-19 ENCOUNTER — Encounter: Payer: 59 | Attending: Physical Medicine & Rehabilitation | Admitting: Physical Medicine & Rehabilitation

## 2018-05-19 ENCOUNTER — Encounter: Payer: Self-pay | Admitting: Physical Medicine & Rehabilitation

## 2018-05-19 VITALS — BP 110/79 | HR 61 | Ht 70.0 in | Wt 128.0 lb

## 2018-05-19 DIAGNOSIS — S14109S Unspecified injury at unspecified level of cervical spinal cord, sequela: Secondary | ICD-10-CM | POA: Diagnosis not present

## 2018-05-19 DIAGNOSIS — Z87820 Personal history of traumatic brain injury: Secondary | ICD-10-CM

## 2018-05-19 DIAGNOSIS — F4320 Adjustment disorder, unspecified: Secondary | ICD-10-CM | POA: Insufficient documentation
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
## 2018-05-19 NOTE — Patient Instructions (Signed)
Link:  [email or web address]    RETURN TO DRIVING PLAN:  WITH THE SUPERVISION OF A LICENSED DRIVER, PLEASE DRIVE IN AN EMPTY PARKING LOT FOR AT LEAST 2-3 TRIALS TO TEST REACTION TIME, VISION, USE OF EQUIPMENT IN CAR, ETC.  IF SUCCESSFUL WITH THE PARKING LOT DRIVING, PROCEED TO SUPERVISED DRIVING TRIALS IN YOUR NEIGHBORHOOD STREETS AT LOW TRAFFIC TIMES TO TEST OBSERVATION TO TRAFFIC SIGNALS, REACTION TIME, ETC. PLEASE ATTEMPT AT LEAST 2-3 TRIALS IN YOUR NEIGHBORHOOD.  IF NEIGHBORHOOD DRIVING IS SUCCESSFUL, YOU MAY PROCEED TO DRIVING IN BUSIER AREAS IN YOUR COMMUNITY WITH SUPERVISION OF A LICENSED DRIVER. PLEASE ATTEMPT AT LEAST 4-5 TRIALS.  IF COMMUNITY DRIVING IS SUCCESSFUL, YOU MAY PROCEED TO DRIVING ALONE, DURING THE DAY TIME, IN NON-PEAK TRAFFIC TIMES. YOU SHOULD DRIVE NO FURTHER THAN 20 MINUTES IN ONE DIRECTION. PLEASE DO NOT DRIVE IF YOU FEEL FATIGUED OR UNDER THE INFLUENCE OF MEDICATION.

## 2018-06-02 ENCOUNTER — Encounter: Payer: Self-pay | Admitting: Family Medicine

## 2018-06-02 ENCOUNTER — Other Ambulatory Visit (HOSPITAL_COMMUNITY)
Admission: RE | Admit: 2018-06-02 | Discharge: 2018-06-02 | Disposition: A | Discharge status: Home / Self Care | Payer: 59 | Source: Ambulatory Visit | Attending: Family Medicine | Admitting: Family Medicine

## 2018-06-02 ENCOUNTER — Other Ambulatory Visit: Payer: Self-pay

## 2018-06-02 ENCOUNTER — Ambulatory Visit (INDEPENDENT_AMBULATORY_CARE_PROVIDER_SITE_OTHER): Payer: 59 | Admitting: Family Medicine

## 2018-06-02 VITALS — BP 111/80 | HR 61 | Temp 98.1°F | Resp 16 | Ht 70.0 in | Wt 126.0 lb

## 2018-06-02 DIAGNOSIS — Z202 Contact with and (suspected) exposure to infections with a predominantly sexual mode of transmission: Secondary | ICD-10-CM | POA: Insufficient documentation

## 2018-06-02 DIAGNOSIS — Z0000 Encounter for general adult medical examination without abnormal findings: Secondary | ICD-10-CM | POA: Diagnosis not present

## 2018-06-02 NOTE — Assessment & Plan Note (Signed)
Check labs.  Treat prn. 

## 2018-06-02 NOTE — Patient Instructions (Signed)
Schedule your pap at your convenience We'll notify you of your lab results and make any changes if needed Keep up the good work!  You look great! Call with any questions or concerns Have a great summer!!

## 2018-06-02 NOTE — Progress Notes (Signed)
   Subjective:    Patient ID: Kylie Osborn, female    DOB: 08/06/1996, 22 y.o.   MRN: [id number]  HPI CPE- pt due to start pap smears but is currently having period.  UTD on immunizations.   Review of Systems Patient reports no vision/ hearing changes, adenopathy,fever, weight change,  persistant/recurrent hoarseness , swallowing issues, chest pain, palpitations, edema, persistant/recurrent cough, hemoptysis, dyspnea (rest/exertional/paroxysmal nocturnal), gastrointestinal bleeding (melena, rectal bleeding), abdominal pain, significant heartburn, bowel changes, GU symptoms (dysuria, hematuria, incontinence),  syncope, focal weakness, memory loss, numbness & tingling, skin/hair/nail changes, abnormal bruising or bleeding, anxiety, or depression.   + vaginal d/c- pt concerned for yeast infection Pt also wants STD screening    Objective:   Physical Exam General Appearance:    Alert, cooperative, no distress, appears stated age  Head:    Normocephalic, without obvious abnormality, atraumatic  Eyes:    PERRL, conjunctiva/corneas clear, EOM's intact, fundi    benign, both eyes  Ears:    Normal TM's and external ear canals, both ears  Nose:   Nares normal, septum midline, mucosa normal, no drainage    or sinus tenderness  Throat:   Lips, mucosa, and tongue normal; teeth and gums normal  Neck:   Supple, symmetrical, trachea midline, no adenopathy;    Thyroid: no enlargement/tenderness/nodules  Back:     Symmetric, no curvature, ROM normal, no CVA tenderness  Lungs:     Clear to auscultation bilaterally, respirations unlabored  Chest Wall:    No tenderness or deformity   Heart:    Regular rate and rhythm, S1 and S2 normal, no murmur, rub   or gallop  Breast Exam:    Deferred at pt's request  Abdomen:     Soft, non-tender, bowel sounds active all four quadrants,    no masses, no organomegaly  Genitalia:    Deferred due to menses  Rectal:    Extremities:   Extremities normal, atraumatic, no  cyanosis or edema  Pulses:   2+ and symmetric all extremities  Skin:   Skin color, texture, turgor normal, no rashes or lesions  Lymph nodes:   Cervical, supraclavicular, and axillary nodes normal  Neurologic:   CNII-XII intact          Assessment & Plan:

## 2018-06-02 NOTE — Assessment & Plan Note (Signed)
Pt's PE unchanged from previous and remains WNL w/ exception of sequela from spinal cord injury.  Due for pap but currently menstruating.  Will screen for STDs at pt's request.  Anticipatory guidance provided.

## 2018-06-03 LAB — URINE CYTOLOGY ANCILLARY ONLY
Chlamydia: NEGATIVE
Neisseria Gonorrhea: NEGATIVE
Trichomonas: NEGATIVE

## 2018-06-03 LAB — HIV ANTIBODY (ROUTINE TESTING W REFLEX): HIV 1&2 Ab, 4th Generation: NONREACTIVE

## 2018-06-03 LAB — RPR: RPR Ser Ql: NONREACTIVE

## 2018-06-05 ENCOUNTER — Other Ambulatory Visit: Payer: Self-pay | Admitting: General Practice

## 2018-06-05 LAB — URINE CYTOLOGY ANCILLARY ONLY
Bacterial vaginitis: POSITIVE — AB
Candida vaginitis: NEGATIVE

## 2018-06-05 MED ORDER — METRONIDAZOLE 500 MG PO TABS: 500.0000 mg | ORAL_TABLET | Freq: Two times a day (BID) | ORAL | 0 refills | Status: DC

## 2018-06-29 ENCOUNTER — Other Ambulatory Visit: Payer: Self-pay | Admitting: Family Medicine

## 2018-09-16 ENCOUNTER — Encounter: Payer: 59 | Attending: Physical Medicine & Rehabilitation | Admitting: Physical Medicine & Rehabilitation

## 2018-11-20 ENCOUNTER — Ambulatory Visit (INDEPENDENT_AMBULATORY_CARE_PROVIDER_SITE_OTHER): Payer: 59 | Admitting: Family Medicine

## 2018-11-20 ENCOUNTER — Ambulatory Visit (INDEPENDENT_AMBULATORY_CARE_PROVIDER_SITE_OTHER): Payer: 59

## 2018-11-20 ENCOUNTER — Encounter: Payer: Self-pay | Admitting: Family Medicine

## 2018-11-20 VITALS — BP 98/60 | HR 71 | Temp 98.4°F | Resp 16 | Ht 70.0 in | Wt 130.0 lb

## 2018-11-20 DIAGNOSIS — Z8709 Personal history of other diseases of the respiratory system: Secondary | ICD-10-CM | POA: Diagnosis not present

## 2018-11-20 DIAGNOSIS — Z23 Encounter for immunization: Secondary | ICD-10-CM | POA: Diagnosis not present

## 2018-11-20 DIAGNOSIS — R0781 Pleurodynia: Secondary | ICD-10-CM | POA: Diagnosis not present

## 2018-11-20 MED ORDER — MELOXICAM 15 MG PO TABS: 15.0000 mg | ORAL_TABLET | Freq: Every day | ORAL | 0 refills | Status: DC
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
## 2018-11-20 NOTE — Patient Instructions (Signed)
Go [address] and get your xray done- we'll call you with the results START the once daily Meloxicam for pain and inflammation- take w/ food INCREASE the Baclofen to 3x/day for the next few days USE a heating pad for pain relief Avoid pushing/pulling/lifting heavy things Call with any questions or concerns Happy New Year!!

## 2018-12-04 ENCOUNTER — Encounter: Payer: Self-pay | Admitting: Family Medicine

## 2018-12-04 ENCOUNTER — Other Ambulatory Visit: Payer: Self-pay

## 2018-12-04 ENCOUNTER — Ambulatory Visit: Payer: 59 | Admitting: Family Medicine

## 2018-12-04 VITALS — BP 110/60 | HR 76 | Temp 98.7°F | Resp 16 | Ht 70.0 in | Wt 127.0 lb

## 2018-12-04 DIAGNOSIS — Z87820 Personal history of traumatic brain injury: Secondary | ICD-10-CM | POA: Diagnosis not present

## 2018-12-04 DIAGNOSIS — R630 Anorexia: Secondary | ICD-10-CM | POA: Diagnosis not present

## 2018-12-04 LAB — CBC WITH DIFFERENTIAL/PLATELET
BASOS PCT: 0.6 % (ref 0.0–3.0)
Basophils Absolute: 0 10*3/uL (ref 0.0–0.1)
EOS PCT: 1.5 % (ref 0.0–5.0)
Eosinophils Absolute: 0.1 10*3/uL (ref 0.0–0.7)
HEMATOCRIT: 40.7 % (ref 36.0–46.0)
HEMOGLOBIN: 13.5 g/dL (ref 12.0–15.0)
LYMPHS PCT: 26.4 % (ref 12.0–46.0)
Lymphs Abs: 2 10*3/uL (ref 0.7–4.0)
MCHC: 33.1 g/dL (ref 30.0–36.0)
MCV: 101.2 fl — AB (ref 78.0–100.0)
MONO ABS: 0.6 10*3/uL (ref 0.1–1.0)
MONOS PCT: 7.6 % (ref 3.0–12.0)
Neutro Abs: 4.8 10*3/uL (ref 1.4–7.7)
Neutrophils Relative %: 63.9 % (ref 43.0–77.0)
Platelets: 338 10*3/uL (ref 150.0–400.0)
RBC: 4.02 Mil/uL (ref 3.87–5.11)
RDW: 12.4 % (ref 11.5–15.5)
WBC: 7.6 10*3/uL (ref 4.0–10.5)

## 2018-12-04 LAB — HEPATIC FUNCTION PANEL
ALBUMIN: 4.5 g/dL (ref 3.5–5.2)
ALT: 12 U/L (ref 0–35)
AST: 19 U/L (ref 0–37)
Alkaline Phosphatase: 83 U/L (ref 39–117)
BILIRUBIN TOTAL: 0.5 mg/dL (ref 0.2–1.2)
Bilirubin, Direct: 0.1 mg/dL (ref 0.0–0.3)
TOTAL PROTEIN: 7.5 g/dL (ref 6.0–8.3)

## 2018-12-04 LAB — BASIC METABOLIC PANEL
BUN: 11 mg/dL (ref 6–23)
CHLORIDE: 104 meq/L (ref 96–112)
CO2: 26 mEq/L (ref 19–32)
CREATININE: 0.82 mg/dL (ref 0.40–1.20)
Calcium: 10 mg/dL (ref 8.4–10.5)
GFR: 112.27 mL/min (ref >60.00)
Glucose, Bld: 76 mg/dL (ref 70–99)
POTASSIUM: 4 meq/L (ref 3.5–5.1)
Sodium: 138 mEq/L (ref 135–145)

## 2018-12-04 LAB — TSH: TSH: 0.5 u[IU]/mL (ref 0.35–4.50)

## 2018-12-04 MED ORDER — MEGESTROL ACETATE 625 MG/5ML PO SUSP: 625.0000 mg | Freq: Every day | ORAL | 1 refills | Status: DC

## 2018-12-04 NOTE — Progress Notes (Signed)
   Subjective:    Patient ID: Kylie Osborn, female    DOB: [birth year], 23 y.o.   MRN: [id number]  HPI Loss of appetite- 'I haven't been hungry'.  'I don't want to eat'.  She says it's not the taste.  Even when she does eat, she eats very small portions.  Reports feeling full quickly.  Pt has never been much of a breakfast eater.  Pt is back in school and her schedule makes eating difficult.  Pt has done protein shakes previously.  Pt reports this has been going on for 'a few months'  Pt would like to gain weight.  Mom feels she needs a medication to stimulate her appetite.  Some nausea, no vomiting.   Review of Systems For ROS see HPI     Objective:   Physical Exam Constitutional:      General: She is not in acute distress.    Appearance: Normal appearance. She is well-developed.  HENT:     Head: Normocephalic and atraumatic.  Eyes:     Conjunctiva/sclera: Conjunctivae normal.     Pupils: Pupils are equal, round, and reactive to light.  Neck:     Musculoskeletal: Normal range of motion and neck supple.     Thyroid: No thyromegaly.  Cardiovascular:     Rate and Rhythm: Normal rate and regular rhythm.     Heart sounds: Normal heart sounds. No murmur.  Pulmonary:     Effort: Pulmonary effort is normal. No respiratory distress.     Breath sounds: Normal breath sounds.  Abdominal:     General: There is no distension.     Palpations: Abdomen is soft.     Tenderness: There is no abdominal tenderness.  Lymphadenopathy:     Cervical: No cervical adenopathy.  Skin:    General: Skin is warm and dry.  Neurological:     Mental Status: She is alert and oriented to person, place, and time.  Psychiatric:        Behavior: Behavior normal.     Comments: Pt's thought process is tangential and difficult to follow           Assessment & Plan:  Loss of appetite- new to provider, ongoing for pt.  Pt and mom are concerned.  Pt's thought process is difficult to follow given hx of TBI.  During  our conversation, she will randomly change direction- 'Do you think I should be a vegan?'  She is a very picky eater to start with and with her brain injury, she now struggles to feel hunger.  Discussed a nutrition referral b/c pt is perseverating on 'eating healthy' and wants to better understand how to do this.  Stressed the need for protein at each meal and the need to eat regularly.  Talked about adding protein shakes or muscle milk.  Add Megace to improve appetite.  Will follow closely.

## 2018-12-04 NOTE — Patient Instructions (Addendum)
Follow up in 1 month to recheck weight We'll notify you of your lab results and make any changes if needed We'll call you with your nutrition appt Keep a food journal of everything you eat and what time you eat it Make sure you are eating protein regularly START the Megace 23ml daily to stimulate your appetite Call with any questions or concerns Hang in there!

## 2018-12-29 ENCOUNTER — Encounter: Payer: Self-pay | Admitting: Occupational Therapy

## 2018-12-29 DIAGNOSIS — I69151 Hemiplegia and hemiparesis following nontraumatic intracerebral hemorrhage affecting right dominant side: Secondary | ICD-10-CM
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
## 2019-01-05 ENCOUNTER — Other Ambulatory Visit: Payer: Self-pay | Admitting: Family Medicine

## 2019-01-05 NOTE — Telephone Encounter (Signed)
Ok to refill? Or does pt need a follow up first?

## 2019-01-06 NOTE — Telephone Encounter (Signed)
I will refill, but pt needs f/u appt to reassess weight and appetite

## 2019-01-06 NOTE — Telephone Encounter (Signed)
Pt has a follow up appt on 2/14

## 2019-01-08 ENCOUNTER — Other Ambulatory Visit: Payer: Self-pay

## 2019-01-08 ENCOUNTER — Encounter: Payer: 59 | Attending: Family Medicine | Admitting: Registered"

## 2019-01-08 ENCOUNTER — Ambulatory Visit (INDEPENDENT_AMBULATORY_CARE_PROVIDER_SITE_OTHER): Payer: 59 | Admitting: Family Medicine

## 2019-01-08 ENCOUNTER — Encounter: Payer: Self-pay | Admitting: Registered"

## 2019-01-08 ENCOUNTER — Encounter: Payer: Self-pay | Admitting: Family Medicine

## 2019-01-08 VITALS — BP 120/72 | HR 68 | Temp 99.0°F | Resp 16 | Ht 70.0 in | Wt 132.2 lb

## 2019-01-08 DIAGNOSIS — Z789 Other specified health status: Secondary | ICD-10-CM | POA: Diagnosis not present

## 2019-01-08 DIAGNOSIS — R630 Anorexia: Secondary | ICD-10-CM | POA: Diagnosis not present
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
## 2019-01-08 NOTE — Patient Instructions (Addendum)
Schedule your complete physical w/ pap in 6 months No changes at this time Continue to try and eat at least 2-3 meals/day with snacks Keep up the good work! Happy Valentine's Day!!!

## 2019-01-08 NOTE — Patient Instructions (Addendum)
-   Aim to have breakfast in the mornings such as carbohydrates + protein.  - Aim to have snacks between meals such as Ensure, fruit + cheese, fruit + nuts, etc.   - Keep snacks handy for travel days.   - See mental health provider to help with coping.

## 2019-01-08 NOTE — Progress Notes (Signed)
   Subjective:    Patient ID: Kylie Osborn, female    DOB: 12-18-1995, 23 y.o.   MRN: [id number]  HPI Decreased appetite/weight gain- pt has gained 5 lbs since last visit.  Pt reports appetite is better.  Did take Megace for a few days but not regularly and is not currently taking.  Pt reports the desire to eat has returned.  Pt is now feeling hunger.  Pt is eating 2 meals most days.  Some snacking- particularly at night in hopes of gaining weight.   Review of Systems For ROS see HPI     Objective:   Physical Exam Vitals signs reviewed.  Constitutional:      General: She is not in acute distress.    Appearance: She is well-developed.  HENT:     Head: Normocephalic and atraumatic.  Eyes:     Conjunctiva/sclera: Conjunctivae normal.     Pupils: Pupils are equal, round, and reactive to light.  Neck:     Musculoskeletal: Normal range of motion and neck supple.     Thyroid: No thyromegaly.  Cardiovascular:     Rate and Rhythm: Normal rate and regular rhythm.     Heart sounds: Normal heart sounds. No murmur.  Pulmonary:     Effort: Pulmonary effort is normal. No respiratory distress.     Breath sounds: Normal breath sounds.  Abdominal:     General: There is no distension.     Palpations: Abdomen is soft.     Tenderness: There is no abdominal tenderness.  Lymphadenopathy:     Cervical: No cervical adenopathy.  Skin:    General: Skin is warm and dry.  Neurological:     Mental Status: She is alert and oriented to person, place, and time.  Psychiatric:        Behavior: Behavior normal.           Assessment & Plan:  Loss of appetite- pt was instructed to work on weight gain after mom was concerned about lack of appetite.  Pt only took Megace for a few days but reports that appetite is better and she now feels hungry.  She has gained 5 lbs, which I applauded.  Will continue to follow closely.

## 2019-01-13 ENCOUNTER — Other Ambulatory Visit: Payer: Self-pay | Admitting: Family Medicine

## 2019-02-02 ENCOUNTER — Other Ambulatory Visit: Payer: Self-pay | Admitting: General Practice

## 2019-02-02 MED ORDER — MEGESTROL ACETATE 625 MG/5ML PO SUSP: 625.0000 mg | Freq: Every day | ORAL | 1 refills | Status: DC

## 2019-03-05 ENCOUNTER — Other Ambulatory Visit: Payer: Self-pay | Admitting: Family Medicine

## 2019-05-12 ENCOUNTER — Telehealth: Payer: Self-pay | Admitting: Family Medicine

## 2019-05-12 NOTE — Telephone Encounter (Signed)
Please advise? Pt is due for CPE per last OV note in August.

## 2019-05-12 NOTE — Telephone Encounter (Signed)
Pt needs a TB skin test done is that ok to have her come in as a nurse visit or does pt need to come in to have a wellness visit? Last appt was 01/08/2019.

## 2019-05-12 NOTE — Telephone Encounter (Signed)
Pt has CPE scheduled.  Can do TB test at nurse visit

## 2019-05-13 NOTE — Telephone Encounter (Signed)
Pt is scheduled °

## 2019-05-17 ENCOUNTER — Ambulatory Visit (INDEPENDENT_AMBULATORY_CARE_PROVIDER_SITE_OTHER): Payer: 59

## 2019-05-17 ENCOUNTER — Other Ambulatory Visit: Payer: Self-pay

## 2019-05-17 DIAGNOSIS — Z111 Encounter for screening for respiratory tuberculosis: Secondary | ICD-10-CM

## 2019-05-17 NOTE — Progress Notes (Addendum)
Administered PPD intradermal right forearm. Patient tolerated well. Will return in two days to have skin test read.   Above order is mine.  Annye Asa, MD

## 2019-05-18 DIAGNOSIS — Z111 Encounter for screening for respiratory tuberculosis: Secondary | ICD-10-CM

## 2019-05-18 NOTE — Addendum Note (Signed)
Addended by: Midge Minium on: 05/18/2019 11:14 AM   Modules accepted: Level of Service

## 2019-05-19 LAB — TB SKIN TEST
Induration: <5 mm
TB Skin Test: NEGATIVE

## 2019-06-18 ENCOUNTER — Other Ambulatory Visit: Payer: Self-pay | Admitting: Family Medicine

## 2019-07-06 ENCOUNTER — Encounter: Payer: Self-pay | Admitting: Family Medicine

## 2019-07-06 ENCOUNTER — Other Ambulatory Visit: Payer: Self-pay

## 2019-07-06 ENCOUNTER — Ambulatory Visit (INDEPENDENT_AMBULATORY_CARE_PROVIDER_SITE_OTHER): Payer: 59 | Admitting: Family Medicine

## 2019-07-06 ENCOUNTER — Other Ambulatory Visit (HOSPITAL_COMMUNITY)
Admission: RE | Admit: 2019-07-06 | Discharge: 2019-07-06 | Disposition: A | Discharge status: Home / Self Care | Payer: 59 | Source: Ambulatory Visit | Attending: Family Medicine | Admitting: Family Medicine

## 2019-07-06 VITALS — BP 110/76 | HR 76 | Temp 98.5°F | Resp 16 | Ht 70.0 in | Wt 135.5 lb

## 2019-07-06 DIAGNOSIS — M79642 Pain in left hand: Secondary | ICD-10-CM | POA: Diagnosis not present

## 2019-07-06 DIAGNOSIS — M25532 Pain in left wrist: Secondary | ICD-10-CM

## 2019-07-06 DIAGNOSIS — Z0000 Encounter for general adult medical examination without abnormal findings: Secondary | ICD-10-CM

## 2019-07-06 DIAGNOSIS — Z124 Encounter for screening for malignant neoplasm of cervix: Secondary | ICD-10-CM

## 2019-07-06 DIAGNOSIS — S14109S Unspecified injury at unspecified level of cervical spinal cord, sequela: Secondary | ICD-10-CM

## 2019-07-06 DIAGNOSIS — Z0001 Encounter for general adult medical examination with abnormal findings: Secondary | ICD-10-CM

## 2019-07-06 DIAGNOSIS — N6320 Unspecified lump in the left breast, unspecified quadrant: Secondary | ICD-10-CM | POA: Diagnosis not present
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
## 2019-07-06 NOTE — Assessment & Plan Note (Signed)
Pt's PE unchanged from previous w/ exception of TTP over L CMC and snuffbox.  Pap done today.  No need for labs at this time.  Anticipatory guidance provided.

## 2019-07-06 NOTE — Patient Instructions (Addendum)
Follow up in 1 year or as needed We'll notify you of your pap results We'll call you with your orthopedic appt and breast imaging Wear the wrist brace until you see orthopedics [facility] for pain relief Call with any questions or concerns Stay Safe!!

## 2019-07-06 NOTE — Assessment & Plan Note (Signed)
Doing well.  Still has mild R sided deficits but has adjusted remarkably well

## 2019-07-08 LAB — CYTOLOGY - PAP
Chlamydia: POSITIVE — AB
Diagnosis: NEGATIVE
HPV: NOT DETECTED
Neisseria Gonorrhea: NEGATIVE

## 2019-07-09 ENCOUNTER — Other Ambulatory Visit: Payer: Self-pay | Admitting: General Practice

## 2019-07-09 ENCOUNTER — Other Ambulatory Visit: Payer: Self-pay | Admitting: Family Medicine

## 2019-07-09 DIAGNOSIS — Z202 Contact with and (suspected) exposure to infections with a predominantly sexual mode of transmission: Secondary | ICD-10-CM

## 2019-07-09 MED ORDER — AZITHROMYCIN 500 MG PO TABS: 500.0000 mg | ORAL_TABLET | Freq: Once | ORAL | 0 refills | Status: AC

## 2019-07-09 MED ORDER — METRONIDAZOLE 500 MG PO TABS: 500.0000 mg | ORAL_TABLET | Freq: Two times a day (BID) | ORAL | 0 refills | Status: DC

## 2019-07-13 ENCOUNTER — Other Ambulatory Visit: Payer: Self-pay

## 2019-07-13 ENCOUNTER — Ambulatory Visit (INDEPENDENT_AMBULATORY_CARE_PROVIDER_SITE_OTHER): Payer: 59 | Admitting: Orthopaedic Surgery

## 2019-07-13 ENCOUNTER — Ambulatory Visit: Payer: Self-pay

## 2019-07-13 DIAGNOSIS — M79642 Pain in left hand: Secondary | ICD-10-CM

## 2019-07-13 MED ORDER — NAPROXEN 500 MG PO TABS: 500.0000 mg | ORAL_TABLET | Freq: Two times a day (BID) | ORAL | 3 refills | Status: DC
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
## 2019-07-16 ENCOUNTER — Ambulatory Visit: Payer: 59

## 2019-07-16 ENCOUNTER — Other Ambulatory Visit: Payer: Self-pay

## 2019-07-16 ENCOUNTER — Other Ambulatory Visit (HOSPITAL_COMMUNITY)
Admission: RE | Admit: 2019-07-16 | Discharge: 2019-07-16 | Disposition: A | Discharge status: Home / Self Care | Payer: 59 | Source: Ambulatory Visit | Attending: Family Medicine | Admitting: Family Medicine

## 2019-07-16 DIAGNOSIS — Z202 Contact with and (suspected) exposure to infections with a predominantly sexual mode of transmission: Secondary | ICD-10-CM

## 2019-07-20 ENCOUNTER — Other Ambulatory Visit: Payer: Self-pay | Admitting: General Practice

## 2019-07-20 ENCOUNTER — Other Ambulatory Visit: Payer: Self-pay

## 2019-07-20 DIAGNOSIS — Z202 Contact with and (suspected) exposure to infections with a predominantly sexual mode of transmission: Secondary | ICD-10-CM

## 2019-07-20 LAB — URINE CYTOLOGY ANCILLARY ONLY: Chlamydia: POSITIVE — AB

## 2019-07-20 MED ORDER — DOXYCYCLINE HYCLATE 100 MG PO TABS: 100.0000 mg | ORAL_TABLET | Freq: Two times a day (BID) | ORAL | 0 refills | Status: DC

## 2019-07-20 MED ORDER — AZITHROMYCIN 500 MG PO TABS: ORAL_TABLET | ORAL | 0 refills | Status: DC

## 2019-07-21 ENCOUNTER — Telehealth: Payer: Self-pay | Admitting: General Practice

## 2019-07-21 DIAGNOSIS — Z640 Problems related to unwanted pregnancy: Secondary | ICD-10-CM

## 2019-07-21 NOTE — Telephone Encounter (Signed)
Called and spoke with pt she advised that upon last visit with PCP for PAP she was advised she should be starting her cycle soon. Cycle never started and pt took a pregnancy test yesterday and advised it came back as positive and she does not want to be pregnant. Pt is ok with a urgent referral to GYN for discussion as to options for ending pregnancy.   PCP is aware of referral.

## 2019-07-26 ENCOUNTER — Other Ambulatory Visit: Payer: Self-pay

## 2019-07-26 ENCOUNTER — Ambulatory Visit
Admission: RE | Admit: 2019-07-26 | Discharge: 2019-07-26 | Disposition: A | Discharge status: Home / Self Care | Payer: 59 | Source: Ambulatory Visit | Attending: Family Medicine | Admitting: Family Medicine

## 2019-07-26 ENCOUNTER — Encounter: Payer: Self-pay | Admitting: General Practice

## 2019-07-26 DIAGNOSIS — N6320 Unspecified lump in the left breast, unspecified quadrant: Secondary | ICD-10-CM

## 2019-09-02 ENCOUNTER — Other Ambulatory Visit: Payer: Self-pay | Admitting: Family Medicine

## 2019-09-22 ENCOUNTER — Other Ambulatory Visit: Payer: Self-pay

## 2019-09-22 ENCOUNTER — Ambulatory Visit (INDEPENDENT_AMBULATORY_CARE_PROVIDER_SITE_OTHER): Payer: 59 | Admitting: Family Medicine

## 2019-09-22 ENCOUNTER — Encounter: Payer: Self-pay | Admitting: Family Medicine

## 2019-09-22 ENCOUNTER — Other Ambulatory Visit (HOSPITAL_COMMUNITY)
Admission: RE | Admit: 2019-09-22 | Discharge: 2019-09-22 | Disposition: A | Discharge status: Home / Self Care | Payer: 59 | Source: Ambulatory Visit | Attending: Family Medicine | Admitting: Family Medicine

## 2019-09-22 VITALS — BP 101/70 | HR 69 | Temp 97.9°F | Resp 16 | Ht 70.0 in | Wt 134.0 lb

## 2019-09-22 DIAGNOSIS — N898 Other specified noninflammatory disorders of vagina: Secondary | ICD-10-CM

## 2019-09-22 DIAGNOSIS — Z23 Encounter for immunization: Secondary | ICD-10-CM | POA: Diagnosis not present

## 2019-09-22 NOTE — Patient Instructions (Signed)
Follow up as needed or as scheduled We'll notify you of your lab results and make any changes if needed Drink plenty of water Call with any questions or concerns Stay Safe!

## 2019-09-22 NOTE — Progress Notes (Signed)
   Subjective:    Patient ID: Kylie Osborn, female    DOB: 20-Jan-1996, 23 y.o.   MRN: [id number]  HPI Vaginal discharge- 'I have a weird d/c coming out'.  LMP 3 weeks ago and started at that time.  Pt reports last period was malodorous.  Pt recently had chlamydia and is not sure if she has been treated adequately.  Denies vaginal or pelvic pain.  Pt reports d/c is 'light brown'.  D/c is not thick or clumpy.  D/c is more watery.  + odor.  No new sexual partners.  No itching.  Denies retained tampon or other foreign body.  Pt has tried vinegar and baking soda baths w/o relief.   Review of Systems For ROS see HPI     Objective:   Physical Exam Vitals signs reviewed.  Constitutional:      General: She is not in acute distress.    Appearance: Normal appearance. She is not ill-appearing.  HENT:     Head: Normocephalic and atraumatic.  Abdominal:     General: Abdomen is flat. There is no distension.     Palpations: Abdomen is soft.     Tenderness: There is no abdominal tenderness. There is no guarding or rebound.  Genitourinary:    Comments: Pt declined GU exam Neurological:     Mental Status: She is alert. Mental status is at baseline.  Psychiatric:        Mood and Affect: Mood normal.        Behavior: Behavior normal.        Thought Content: Thought content normal.           Assessment & Plan:  Vaginal d/c- new.  Pt reports d/c is strong smelling and watery.  Denies pain.  No itching.  She recently had chlamydia and had a difficult time w/ the Azithromycin (caused vomiting) so unsure if fully treated.  Will repeat GC/CT testing as well as yeast, BV, and trich.  Encouraged safe sex.  Will follow.

## 2019-09-28 LAB — URINE CYTOLOGY ANCILLARY ONLY
Bacterial Vaginitis-Urine: POSITIVE — AB
Bacterial Vaginitis-Urine: POSITIVE — AB
Bacterial Vaginitis-Urine: POSITIVE — AB
Bacterial Vaginitis-Urine: POSITIVE — AB
Bacterial Vaginitis-Urine: POSITIVE — AB
Candida Urine: NEGATIVE
Chlamydia: NEGATIVE
Comment: NEGATIVE
Comment: NEGATIVE
Comment: NORMAL
Neisseria Gonorrhea: NEGATIVE
Trichomonas: NEGATIVE

## 2019-09-29 ENCOUNTER — Other Ambulatory Visit: Payer: Self-pay | Admitting: General Practice

## 2019-09-29 MED ORDER — METRONIDAZOLE 500 MG PO TABS: 500.0000 mg | ORAL_TABLET | Freq: Two times a day (BID) | ORAL | 0 refills | Status: DC

## 2019-10-27 ENCOUNTER — Other Ambulatory Visit: Payer: Self-pay

## 2019-10-27 DIAGNOSIS — Z20822 Contact with and (suspected) exposure to covid-19: Secondary | ICD-10-CM

## 2019-10-29 LAB — NOVEL CORONAVIRUS, NAA: SARS-CoV-2, NAA: NOT DETECTED

## 2019-11-21 IMAGING — US US BREAST*L* LIMITED INC AXILLA
1 series · 5 of 5 positions shown · non-contrast
Comparison: Previous exam(s).

CLINICAL DATA: Patient for delayed short-term follow-up palpable
left breast mass.

EXAM:
ULTRASOUND OF THE LEFT BREAST

[Series 1: us breast*left* limited inc axilla · 0.05mm/px · 5 of 5 slices shown]
[im 1/5]
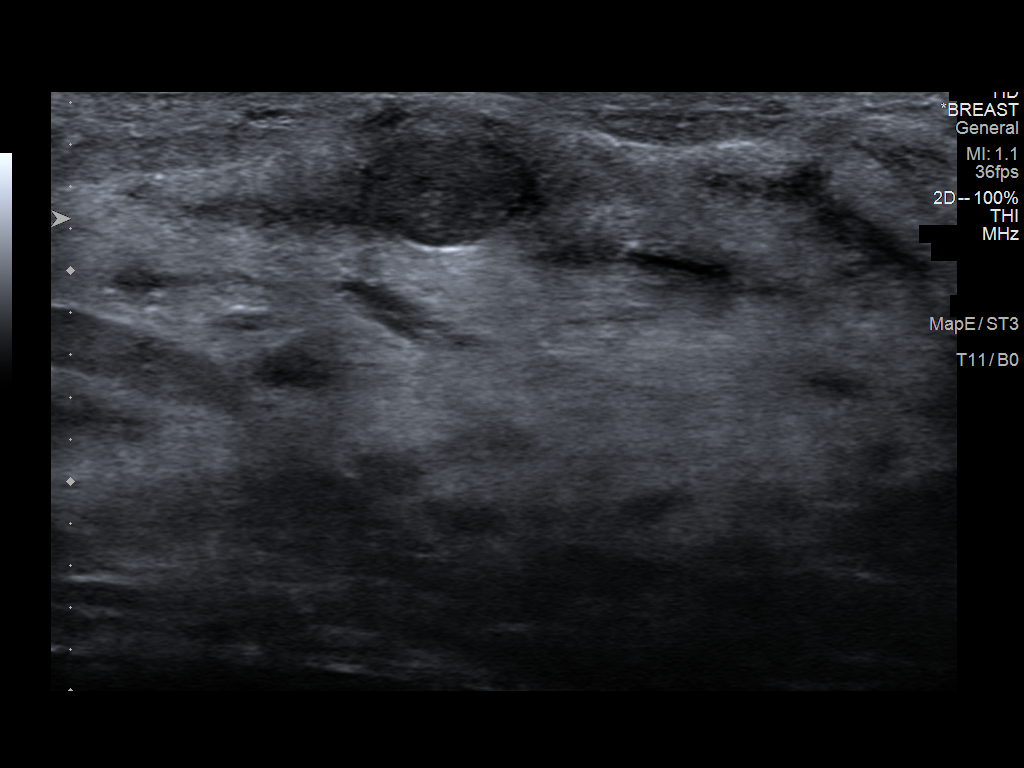
[im 2/5]
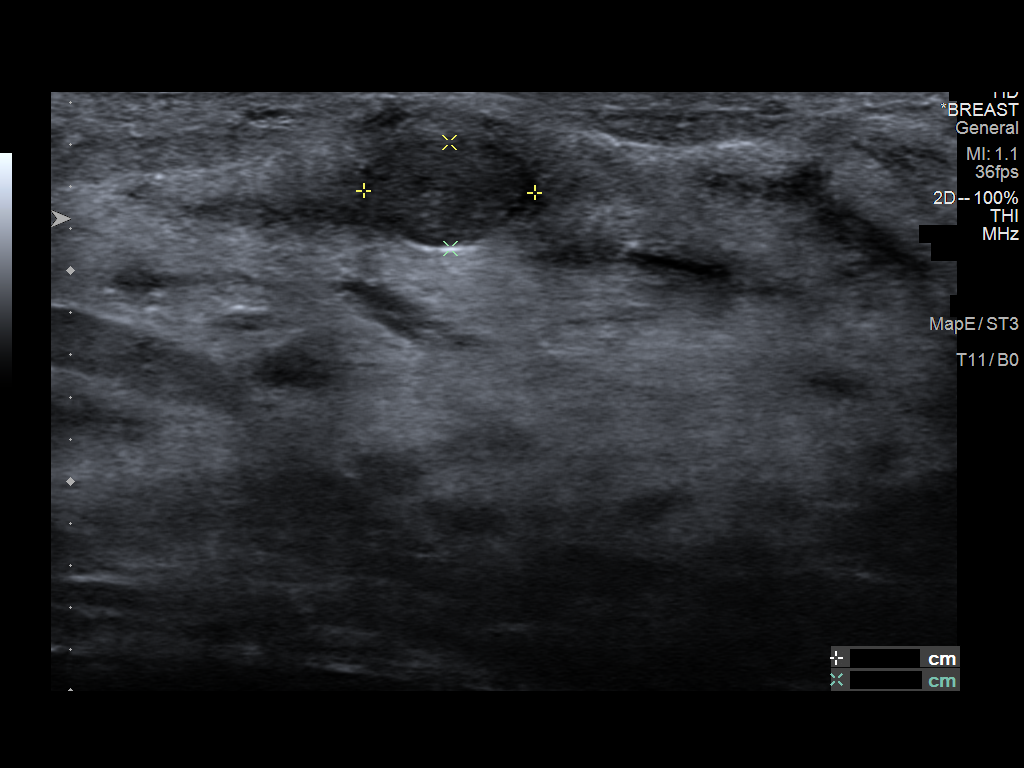
[im 3/5]
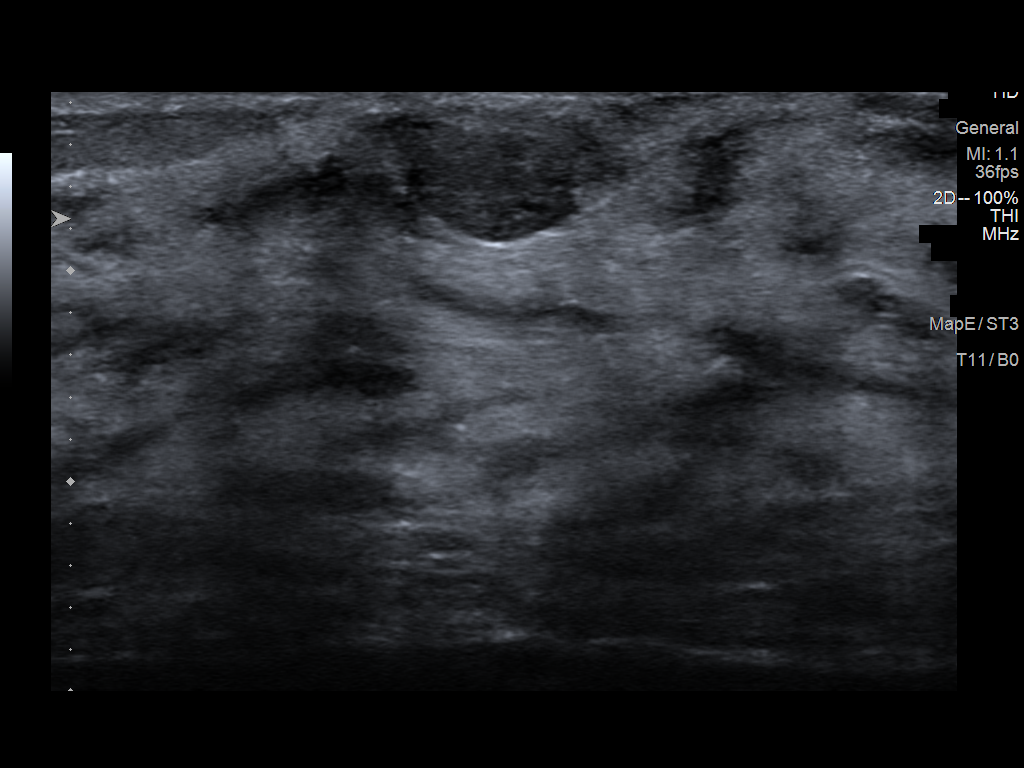
[im 4/5]
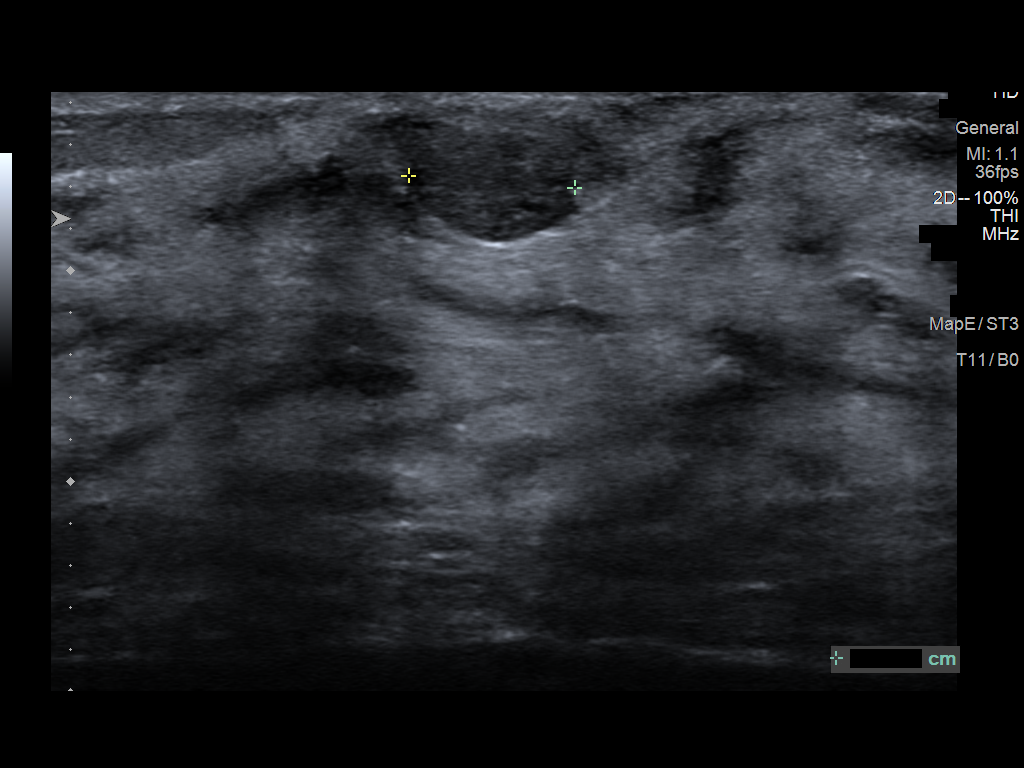
[im 5/5]
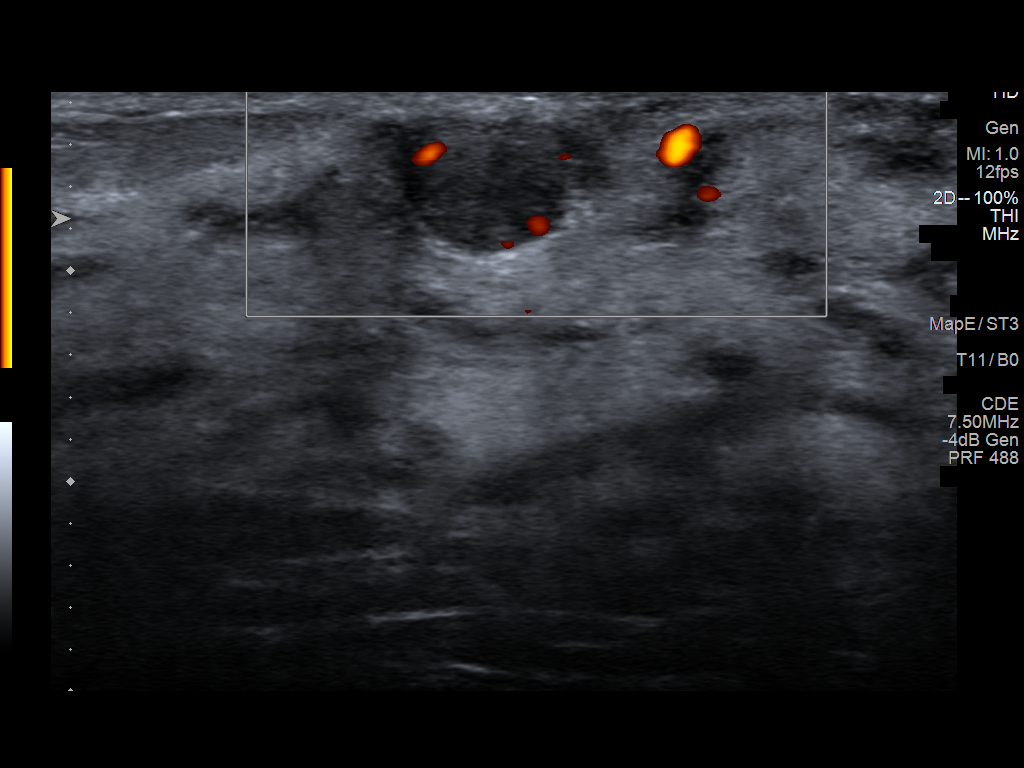

[5 of 5 positions shown; findings below may reference images not displayed]

FINDINGS: Targeted ultrasound is performed, showing a 5 x 8 x 8 mm oval
circumscribed hypoechoic mass left breast 11 o'clock position
retroareolar location, slightly decreased from prior were it
measured 9 x 8 x 10 mm.
IMPRESSION: Benign left breast mass given stability over time, most compatible
with a fibroadenoma.

RECOMMENDATION:
Continued clinical evaluation for palpable left breast abnormality.

Screening mammogram at age 41 unless there are persistent or
intervening clinical concerns. (Code:A1-D-Y92)

I have discussed the findings and recommendations with the patient.
Results were also provided in writing at the conclusion of the
visit. If applicable, a reminder letter will be sent to the patient
regarding the next appointment.

BI-RADS CATEGORY  2: Benign.

## 2020-02-09 ENCOUNTER — Ambulatory Visit: Payer: Medicare Other | Attending: Internal Medicine

## 2020-02-09 DIAGNOSIS — Z20822 Contact with and (suspected) exposure to covid-19: Secondary | ICD-10-CM

## 2020-02-10 LAB — NOVEL CORONAVIRUS, NAA: SARS-CoV-2, NAA: NOT DETECTED

## 2020-04-28 ENCOUNTER — Telehealth: Payer: Self-pay | Admitting: Family Medicine
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
## 2020-04-28 NOTE — Telephone Encounter (Signed)
Spoke with mother.  States she picked this up at her OV today.

## 2020-04-28 NOTE — Telephone Encounter (Signed)
Immunization record printed. Will be placed at front desk for pick up.

## 2020-07-10 ENCOUNTER — Other Ambulatory Visit: Payer: Self-pay | Admitting: Family Medicine

## 2020-08-24 ENCOUNTER — Other Ambulatory Visit: Payer: Medicare Other

## 2020-08-24 DIAGNOSIS — Z20822 Contact with and (suspected) exposure to covid-19: Secondary | ICD-10-CM

## 2020-08-25 LAB — SARS-COV-2, NAA 2 DAY TAT

## 2020-08-25 LAB — NOVEL CORONAVIRUS, NAA: SARS-CoV-2, NAA: NOT DETECTED

## 2020-10-05 ENCOUNTER — Other Ambulatory Visit: Payer: Self-pay | Admitting: Family Medicine

## 2020-10-05 NOTE — Telephone Encounter (Signed)
Last OV 09/22/2019 Last refill 09/02/2019        135 tab with 1 refill No upcoming appointment.

## 2020-11-03 ENCOUNTER — Other Ambulatory Visit: Payer: Self-pay

## 2020-11-03 ENCOUNTER — Encounter: Payer: Self-pay | Admitting: Physician Assistant

## 2020-11-03 ENCOUNTER — Telehealth (INDEPENDENT_AMBULATORY_CARE_PROVIDER_SITE_OTHER): Payer: 59 | Admitting: Physician Assistant

## 2020-11-03 ENCOUNTER — Ambulatory Visit: Payer: Medicare Other | Admitting: Family Medicine

## 2020-11-03 VITALS — Wt 124.0 lb

## 2020-11-03 DIAGNOSIS — F321 Major depressive disorder, single episode, moderate: Secondary | ICD-10-CM

## 2020-11-03 MED ORDER — VENLAFAXINE HCL ER 75 MG PO CP24: 75.0000 mg | ORAL_CAPSULE | Freq: Every day | ORAL | 1 refills | Status: DC

## 2020-11-03 NOTE — Patient Instructions (Signed)
Instructions sent to MyChart

## 2020-11-03 NOTE — Progress Notes (Signed)
I have discussed the procedure for the virtual visit with the patient who has given consent to proceed with assessment and treatment.   Charrie Mcconnon S Shilynn Hoch, CMA     
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
## 2020-12-05 ENCOUNTER — Other Ambulatory Visit: Payer: 59

## 2020-12-05 DIAGNOSIS — Z20822 Contact with and (suspected) exposure to covid-19: Secondary | ICD-10-CM

## 2020-12-07 LAB — NOVEL CORONAVIRUS, NAA: SARS-CoV-2, NAA: NOT DETECTED

## 2020-12-07 LAB — SARS-COV-2, NAA 2 DAY TAT

## 2020-12-08 ENCOUNTER — Other Ambulatory Visit: Payer: Self-pay | Admitting: Physician Assistant

## 2021-02-06 ENCOUNTER — Telehealth: Payer: Self-pay | Admitting: Family Medicine

## 2021-02-06 NOTE — Telephone Encounter (Signed)
Attempted to schedule AWV. Unable to LVM.  Will try at later time.  

## 2021-02-23 ENCOUNTER — Other Ambulatory Visit: Payer: Self-pay | Admitting: Family Medicine

## 2021-02-28 ENCOUNTER — Telehealth: Payer: Self-pay | Admitting: Family Medicine

## 2021-03-02 NOTE — Telephone Encounter (Signed)
Attempted to schedule AWV. Unable to LVM.  Will try at later time.  

## 2021-04-09 ENCOUNTER — Encounter: Payer: Self-pay | Admitting: Family Medicine

## 2021-04-09 ENCOUNTER — Other Ambulatory Visit: Payer: Self-pay

## 2021-04-09 ENCOUNTER — Ambulatory Visit (INDEPENDENT_AMBULATORY_CARE_PROVIDER_SITE_OTHER): Payer: Medicare Other | Admitting: Family Medicine

## 2021-04-09 VITALS — BP 116/80 | HR 67 | Temp 97.6°F | Resp 18 | Ht 70.0 in | Wt 125.2 lb

## 2021-04-09 DIAGNOSIS — F339 Major depressive disorder, recurrent, unspecified: Secondary | ICD-10-CM | POA: Insufficient documentation

## 2021-04-09 DIAGNOSIS — Z681 Body mass index (BMI) 19 or less, adult: Secondary | ICD-10-CM | POA: Diagnosis not present

## 2021-04-09 DIAGNOSIS — R112 Nausea with vomiting, unspecified: Secondary | ICD-10-CM

## 2021-04-09 LAB — CBC WITH DIFFERENTIAL/PLATELET
Basophils Absolute: 0 10*3/uL (ref 0.0–0.1)
Basophils Relative: 0.2 % (ref 0.0–3.0)
Eosinophils Absolute: 0.1 10*3/uL (ref 0.0–0.7)
Eosinophils Relative: 1.7 % (ref 0.0–5.0)
HCT: 40.6 % (ref 36.0–46.0)
Hemoglobin: 13.8 g/dL (ref 12.0–15.0)
Lymphocytes Relative: 24.8 % (ref 12.0–46.0)
Lymphs Abs: 1.4 10*3/uL (ref 0.7–4.0)
MCHC: 33.9 g/dL (ref 30.0–36.0)
MCV: 101.1 fl — ABNORMAL HIGH (ref 78.0–100.0)
Monocytes Absolute: 0.5 10*3/uL (ref 0.1–1.0)
Monocytes Relative: 9.5 % (ref 3.0–12.0)
Neutro Abs: 3.6 10*3/uL (ref 1.4–7.7)
Neutrophils Relative %: 63.8 % (ref 43.0–77.0)
Platelets: 239 10*3/uL (ref 150.0–400.0)
RBC: 4.02 Mil/uL (ref 3.87–5.11)
RDW: 12.5 % (ref 11.5–15.5)
WBC: 5.6 10*3/uL (ref 4.0–10.5)

## 2021-04-09 LAB — BASIC METABOLIC PANEL
BUN: 15 mg/dL (ref 6–23)
CO2: 26 mEq/L (ref 19–32)
Calcium: 9.3 mg/dL (ref 8.4–10.5)
Chloride: 103 mEq/L (ref 96–112)
Creatinine, Ser: 0.78 mg/dL (ref 0.40–1.20)
GFR: 106.48 mL/min (ref >60.00)
Glucose, Bld: 72 mg/dL (ref 70–99)
Potassium: 3.7 mEq/L (ref 3.5–5.1)
Sodium: 138 mEq/L (ref 135–145)

## 2021-04-09 LAB — LIPID PANEL
Cholesterol: 152 mg/dL (ref 0–200)
HDL: 52.1 mg/dL (ref >39.00)
LDL Cholesterol: 89 mg/dL (ref 0–99)
NonHDL: 99.91
Total CHOL/HDL Ratio: 3
Triglycerides: 56 mg/dL (ref 0.0–149.0)
VLDL: 11.2 mg/dL (ref 0.0–40.0)

## 2021-04-09 LAB — HEPATIC FUNCTION PANEL
ALT: 9 U/L (ref 0–35)
AST: 18 U/L (ref 0–37)
Albumin: 4.6 g/dL (ref 3.5–5.2)
Alkaline Phosphatase: 62 U/L (ref 39–117)
Bilirubin, Direct: 0.1 mg/dL (ref 0.0–0.3)
Total Bilirubin: 0.4 mg/dL (ref 0.2–1.2)
Total Protein: 7.2 g/dL (ref 6.0–8.3)

## 2021-04-09 LAB — TSH: TSH: 1.2 u[IU]/mL (ref 0.35–4.50)

## 2021-04-09 MED ORDER — BACLOFEN 10 MG PO TABS: 5.0000 mg | ORAL_TABLET | Freq: Three times a day (TID) | ORAL | 1 refills | Status: DC

## 2021-04-09 MED ORDER — VENLAFAXINE HCL ER 150 MG PO CP24: 150.0000 mg | ORAL_CAPSULE | Freq: Every day | ORAL | 3 refills | Status: DC
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
## 2021-04-09 NOTE — Patient Instructions (Signed)
Follow up in 3-4 weeks to recheck mood We'll notify you of your lab results and make any changes if needed INCREASE the Venlafaxine to 150mg  daily to help w/ mood.  Take 2 of what you have at home and 1 of the new prescription We'll call you to set up a counseling appt I refilled your Baclofen Make sure to drink plenty of water and eat regularly Call with any questions or concerns Hang in there!

## 2021-04-30 ENCOUNTER — Telehealth: Payer: Self-pay | Admitting: Family Medicine

## 2021-04-30 ENCOUNTER — Telehealth: Payer: Self-pay | Admitting: Physical Medicine and Rehabilitation

## 2021-04-30 NOTE — Telephone Encounter (Signed)
Patient called needing to schedule an appointment with Dr Alvester Morin for her back. The number to contact patient is [phone]

## 2021-04-30 NOTE — Telephone Encounter (Signed)
Patient last seen 04/09/21. Baclofen refilled at that time. Please advise.

## 2021-04-30 NOTE — Telephone Encounter (Signed)
Pt's mom called in asking if Dr. Beverely Low would call in an antiinflammatory for pt's back pain.  Please advise pt has an appt with Tabori on 05/07/21

## 2021-05-01 ENCOUNTER — Other Ambulatory Visit: Payer: Self-pay

## 2021-05-01 MED ORDER — MELOXICAM 15 MG PO TABS: 15.0000 mg | ORAL_TABLET | Freq: Every day | ORAL | 1 refills | Status: DC | PRN

## 2021-05-01 NOTE — Telephone Encounter (Signed)
Ok for Meloxicam 15mg  1 tab daily as needed for pain/inflammation, #30, 1 refill

## 2021-05-01 NOTE — Telephone Encounter (Signed)
Scheduled for new patient OV for low back and right scapula pain.

## 2021-05-01 NOTE — Telephone Encounter (Signed)
Medication sent to pharmacy  

## 2021-05-03 ENCOUNTER — Other Ambulatory Visit: Payer: Self-pay | Admitting: Family Medicine

## 2021-05-06 NOTE — Assessment & Plan Note (Signed)
Deteriorated.  PHQ=14.  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  Increased anxiety and depression.  Increased crying.  Is going through a difficult breakup.  Is willing to do counseling- referral placed.  Encouraged her to increase the Venlafaxine until mood improves.  New prescription sent.  Will follow.
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
## 2021-05-07 ENCOUNTER — Other Ambulatory Visit: Payer: Self-pay

## 2021-05-07 ENCOUNTER — Encounter: Payer: Self-pay | Admitting: Family Medicine

## 2021-05-07 ENCOUNTER — Ambulatory Visit (INDEPENDENT_AMBULATORY_CARE_PROVIDER_SITE_OTHER): Payer: Medicare Other | Admitting: Family Medicine

## 2021-05-07 VITALS — BP 110/70 | HR 51 | Temp 99.1°F | Resp 17 | Ht 70.0 in | Wt 127.8 lb

## 2021-05-07 DIAGNOSIS — M25511 Pain in right shoulder: Secondary | ICD-10-CM

## 2021-05-07 DIAGNOSIS — G8929 Other chronic pain: Secondary | ICD-10-CM

## 2021-05-07 DIAGNOSIS — F339 Major depressive disorder, recurrent, unspecified: Secondary | ICD-10-CM | POA: Diagnosis not present

## 2021-05-07 DIAGNOSIS — M545 Low back pain, unspecified: Secondary | ICD-10-CM | POA: Diagnosis not present

## 2021-05-07 NOTE — Patient Instructions (Signed)
Schedule your complete physical sometime this summer We'll call you with your physical therapy appt to help w/ back and shoulder pain START the Meloxicam once daily- take w/ food CONTINUE the Venlafaxine daily Call with any questions or concerns Stay Safe!  Stay Healthy! Have a great summer!!!
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
## 2021-05-07 NOTE — Assessment & Plan Note (Signed)
Improved w/ increased dose of Venlafaxine.  No med changes at this time.  Will continue to follow.

## 2021-05-15 ENCOUNTER — Encounter: Payer: Self-pay | Admitting: Physical Medicine and Rehabilitation

## 2021-05-15 ENCOUNTER — Ambulatory Visit (INDEPENDENT_AMBULATORY_CARE_PROVIDER_SITE_OTHER): Payer: Medicare Other | Admitting: Physical Medicine and Rehabilitation

## 2021-05-15 ENCOUNTER — Ambulatory Visit: Payer: Self-pay

## 2021-05-15 ENCOUNTER — Other Ambulatory Visit: Payer: Self-pay

## 2021-05-15 VITALS — BP 101/69 | HR 69

## 2021-05-15 DIAGNOSIS — M47816 Spondylosis without myelopathy or radiculopathy, lumbar region: Secondary | ICD-10-CM | POA: Diagnosis not present

## 2021-05-15 DIAGNOSIS — M25511 Pain in right shoulder: Secondary | ICD-10-CM | POA: Diagnosis not present

## 2021-05-15 DIAGNOSIS — M7918 Myalgia, other site: Secondary | ICD-10-CM | POA: Diagnosis not present

## 2021-05-15 DIAGNOSIS — S14109S Unspecified injury at unspecified level of cervical spinal cord, sequela: Secondary | ICD-10-CM

## 2021-05-15 DIAGNOSIS — M545 Low back pain, unspecified: Secondary | ICD-10-CM | POA: Diagnosis not present

## 2021-05-15 DIAGNOSIS — G8929 Other chronic pain: Secondary | ICD-10-CM | POA: Diagnosis not present

## 2021-05-15 DIAGNOSIS — Z981 Arthrodesis status: Secondary | ICD-10-CM

## 2021-05-15 DIAGNOSIS — R269 Unspecified abnormalities of gait and mobility: Secondary | ICD-10-CM

## 2021-05-15 NOTE — Progress Notes (Signed)
Pain in back- mostly low back. Sometimes has hip pain. Bending, monthly cycle increase low back pain. Right scapula and shoulder pain. Exercise, sleeping on right side for a long period of time makes shoulder pain worse. Tries to lift weights. States that this affects her right knee and that her "whole right side is weaker."  Numeric Pain Rating Scale and Functional Assessment Average Pain 7 Pain Right Now 4 My pain is constant, dull, and aching Pain is worse with: some activites Pain improves with: rest and stretching   In the last MONTH (on 0-10 scale) has pain interfered with the following?  1. General activity like being  able to carry out your everyday physical activities such as walking, climbing stairs, carrying groceries, or moving a chair?  Rating(3)  2. Relation with others like being able to carry out your usual social activities and roles such as  activities at home, at work and in your community. Rating(3)  3. Enjoyment of life such that you have  been bothered by emotional problems such as feeling anxious, depressed or irritable?  Rating(9)
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
## 2021-05-16 ENCOUNTER — Encounter: Payer: Self-pay | Admitting: Physical Medicine and Rehabilitation

## 2021-05-23 ENCOUNTER — Encounter: Payer: Self-pay | Admitting: *Deleted

## 2021-05-28 ENCOUNTER — Other Ambulatory Visit: Payer: Self-pay | Admitting: Family Medicine

## 2021-06-09 ENCOUNTER — Other Ambulatory Visit: Payer: Self-pay | Admitting: Family Medicine

## 2021-06-26 ENCOUNTER — Other Ambulatory Visit: Payer: Self-pay

## 2021-06-26 ENCOUNTER — Ambulatory Visit (INDEPENDENT_AMBULATORY_CARE_PROVIDER_SITE_OTHER): Payer: Medicare Other | Admitting: Physical Medicine and Rehabilitation

## 2021-06-26 ENCOUNTER — Encounter: Payer: Self-pay | Admitting: Physical Medicine and Rehabilitation

## 2021-06-26 ENCOUNTER — Encounter: Payer: Medicare Other | Admitting: Family Medicine

## 2021-06-26 VITALS — BP 100/68 | HR 77

## 2021-06-26 DIAGNOSIS — Z981 Arthrodesis status: Secondary | ICD-10-CM

## 2021-06-26 DIAGNOSIS — M25511 Pain in right shoulder: Secondary | ICD-10-CM

## 2021-06-26 DIAGNOSIS — M545 Low back pain, unspecified: Secondary | ICD-10-CM | POA: Diagnosis not present

## 2021-06-26 DIAGNOSIS — M47816 Spondylosis without myelopathy or radiculopathy, lumbar region: Secondary | ICD-10-CM | POA: Diagnosis not present

## 2021-06-26 DIAGNOSIS — R269 Unspecified abnormalities of gait and mobility: Secondary | ICD-10-CM

## 2021-06-26 DIAGNOSIS — G8929 Other chronic pain: Secondary | ICD-10-CM

## 2021-06-26 DIAGNOSIS — M7918 Myalgia, other site: Secondary | ICD-10-CM

## 2021-06-26 DIAGNOSIS — S14109S Unspecified injury at unspecified level of cervical spinal cord, sequela: Secondary | ICD-10-CM

## 2021-06-26 NOTE — Progress Notes (Signed)
Patient states that she is doing better. Her pain is improving. Still having pain in same areas of right shoulder and low back. Has been to PT.  Numeric Pain Rating Scale and Functional Assessment Average Pain 5   In the last MONTH (on 0-10 scale) has pain interfered with the following?  1. General activity like being  able to carry out your everyday physical activities such as walking, climbing stairs, carrying groceries, or moving a chair?  Rating(4)   +
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
## 2021-07-05 ENCOUNTER — Other Ambulatory Visit: Payer: Self-pay | Admitting: Family Medicine

## 2021-07-05 NOTE — Telephone Encounter (Signed)
LFD 05/01/21 #30 with 1 refill LOV 05/07/21 NOV 08/02/21

## 2021-07-06 ENCOUNTER — Telehealth: Payer: Self-pay | Admitting: Family Medicine

## 2021-07-06 NOTE — Telephone Encounter (Signed)
Left message for patient to call back and schedule Medicare Annual Wellness Visit (AWV).   Please offer to do virtually or by telephone.  Left office number and my jabber [phone].  Due for AWVI  Please schedule at anytime with Nurse Health Advisor.

## 2021-08-02 ENCOUNTER — Encounter: Payer: Self-pay | Admitting: Physical Medicine and Rehabilitation

## 2021-08-02 ENCOUNTER — Encounter: Payer: 59 | Admitting: Family Medicine

## 2021-08-17 ENCOUNTER — Encounter: Payer: Self-pay | Admitting: Family Medicine

## 2021-08-17 ENCOUNTER — Other Ambulatory Visit (HOSPITAL_COMMUNITY)
Admission: RE | Admit: 2021-08-17 | Discharge: 2021-08-17 | Disposition: A | Discharge status: Home / Self Care | Payer: 59 | Source: Ambulatory Visit | Attending: Family Medicine | Admitting: Family Medicine

## 2021-08-17 ENCOUNTER — Other Ambulatory Visit: Payer: Self-pay

## 2021-08-17 ENCOUNTER — Ambulatory Visit (INDEPENDENT_AMBULATORY_CARE_PROVIDER_SITE_OTHER): Payer: 59 | Admitting: Family Medicine

## 2021-08-17 VITALS — BP 110/70 | HR 70 | Temp 97.5°F | Resp 16 | Ht 70.0 in | Wt 127.2 lb

## 2021-08-17 DIAGNOSIS — Z202 Contact with and (suspected) exposure to infections with a predominantly sexual mode of transmission: Secondary | ICD-10-CM | POA: Insufficient documentation

## 2021-08-17 DIAGNOSIS — Z0000 Encounter for general adult medical examination without abnormal findings: Secondary | ICD-10-CM | POA: Diagnosis not present

## 2021-08-17 NOTE — Assessment & Plan Note (Signed)
Pt's PE WNL.  UTD on pap, Tdap.  Declines flu.  No need for labs as these were done in May.  STD testing at pt request.  Anticipatory guidance provided.

## 2021-08-17 NOTE — Progress Notes (Signed)
   Subjective:    Patient ID: Kylie Osborn, female    DOB: [birth year], 25 y.o.   MRN: [id number]  HPI CPE- UTD on pap, Tdap.  Due for flu- declines  Health Maintenance  Topic Date Due   INFLUENZA VACCINE  02/22/2022 (Originally 06/25/2021)   Hepatitis C Screening  04/09/2022 (Originally 01/29/2015)   PAP-Cervical Cytology Screening  07/05/2022   PAP SMEAR-Modifier  07/05/2022   TETANUS/TDAP  04/07/2027   HPV VACCINES  Completed   COVID-19 Vaccine  Completed   HIV Screening  Completed      Review of Systems Patient reports no vision/ hearing changes, adenopathy,fever, weight change,  persistant/recurrent hoarseness , swallowing issues, chest pain, palpitations, edema, persistant/recurrent cough, hemoptysis, dyspnea (rest/exertional/paroxysmal nocturnal), gastrointestinal bleeding (melena, rectal bleeding), abdominal pain, significant heartburn, bowel changes, GU symptoms (dysuria, hematuria, incontinence), Gyn symptoms (abnormal  bleeding, pain),  syncope, focal weakness, memory loss, numbness & tingling, skin/hair/nail changes, abnormal bruising or bleeding, anxiety, or depression.   This visit occurred during the SARS-CoV-2 public health emergency.  Safety protocols were in place, including screening questions prior to the visit, additional usage of staff PPE, and extensive cleaning of exam room while observing appropriate contact time as indicated for disinfecting solutions.      Objective:   Physical Exam General Appearance:    Alert, cooperative, no distress, appears stated age  Head:    Normocephalic, without obvious abnormality, atraumatic  Eyes:    PERRL, conjunctiva/corneas clear, EOM's intact, fundi    benign, both eyes  Ears:    Normal TM's and external ear canals, both ears  Nose:   Deferred due to COVID  Throat:   Neck:   Supple, symmetrical, trachea midline, no adenopathy;    Thyroid: no enlargement/tenderness/nodules  Back:     Symmetric, no curvature, ROM normal, no CVA  tenderness  Lungs:     Clear to auscultation bilaterally, respirations unlabored  Chest Wall:    No tenderness or deformity   Heart:    Regular rate and rhythm, S1 and S2 normal, no murmur, rub   or gallop  Breast Exam:    Deferred to GYN  Abdomen:     Soft, non-tender, bowel sounds active all four quadrants,    no masses, no organomegaly  Genitalia:    Deferred to GYN  Rectal:    Extremities:   Extremities normal, atraumatic, no cyanosis or edema  Pulses:   2+ and symmetric all extremities  Skin:   Skin color, texture, turgor normal, no rashes or lesions  Lymph nodes:   Cervical, supraclavicular, and axillary nodes normal  Neurologic:   CNII-XII intact, normal strength, sensation and reflexes    throughout          Assessment & Plan:

## 2021-08-17 NOTE — Patient Instructions (Signed)
Follow up in 1 year or as needed We'll notify you of your lab results and make any changes if needed Keep up the good work!  You look great! Call with any questions or concerns It's time to invest in yourself!  Take that test!

## 2021-08-20 LAB — RPR: RPR Ser Ql: NONREACTIVE

## 2021-08-20 LAB — URINE CYTOLOGY ANCILLARY ONLY
Chlamydia: NEGATIVE
Comment: NEGATIVE
Comment: NORMAL
Neisseria Gonorrhea: NEGATIVE

## 2021-08-20 LAB — HIV ANTIBODY (ROUTINE TESTING W REFLEX): HIV 1&2 Ab, 4th Generation: NONREACTIVE

## 2021-08-21 LAB — HSV(HERPES SMPLX)ABS-I+II(IGG+IGM)-BLD
HSV 1 Glycoprotein G Ab, IgG: <0.91 index (ref 0.00–0.90)
HSV 2 IgG, Type Spec: <0.91 index (ref 0.00–0.90)
HSVI/II Comb IgM: <0.91 Ratio (ref 0.00–0.90)

## 2021-08-23 ENCOUNTER — Ambulatory Visit (INDEPENDENT_AMBULATORY_CARE_PROVIDER_SITE_OTHER): Payer: Medicare Other | Admitting: Family Medicine

## 2021-08-23 ENCOUNTER — Encounter: Payer: Self-pay | Admitting: Family Medicine

## 2021-08-23 ENCOUNTER — Other Ambulatory Visit: Payer: Self-pay

## 2021-08-23 VITALS — BP 110/68 | HR 84 | Temp 97.5°F | Resp 16 | Ht 70.0 in | Wt 125.8 lb

## 2021-08-23 DIAGNOSIS — Z23 Encounter for immunization: Secondary | ICD-10-CM

## 2021-08-23 DIAGNOSIS — L700 Acne vulgaris: Secondary | ICD-10-CM | POA: Insufficient documentation

## 2021-08-23 DIAGNOSIS — F3181 Bipolar II disorder: Secondary | ICD-10-CM | POA: Diagnosis not present

## 2021-08-23 DIAGNOSIS — F339 Major depressive disorder, recurrent, unspecified: Secondary | ICD-10-CM | POA: Diagnosis not present

## 2021-08-23 MED ORDER — QUETIAPINE FUMARATE 100 MG PO TABS: ORAL_TABLET | ORAL | 3 refills | Status: DC

## 2021-08-23 NOTE — Progress Notes (Signed)
   Subjective:    Patient ID: Kylie Osborn, female    DOB: 21-Oct-1996, 25 y.o.   MRN: [id number]  HPI Mood- pt reports she has been having 'extreme anxiety'.  Says mood has been up and down.  Admits to anger and outbursts.  Pt says she has a hard time controlling her emotions.  Pt feels 'woozy' when she doesn't take medication.  Pt admits that sxs are better controlled when she takes her medication and it's the days she doesn't that sxs are more noticeable.  Pt feels she has been depressed for 'over a year'.  Not currently in counseling- not interested in virtual- but not opposed to talking to someone.  Pt feels that she may be bipolar and thinks she may have been bipolar prior to her TBI.  Mom agrees w/ this assessment.   Review of Systems For ROS see HPI   This visit occurred during the SARS-CoV-2 public health emergency.  Safety protocols were in place, including screening questions prior to the visit, additional usage of staff PPE, and extensive cleaning of exam room while observing appropriate contact time as indicated for disinfecting solutions.      Objective:   Physical Exam Vitals reviewed.  Constitutional:      General: She is not in acute distress.    Appearance: Normal appearance. She is not ill-appearing.  HENT:     Head: Normocephalic and atraumatic.  Neurological:     General: No focal deficit present.     Mental Status: She is alert and oriented to person, place, and time.  Psychiatric:        Mood and Affect: Mood normal.        Behavior: Behavior normal.        Thought Content: Thought content normal.          Assessment & Plan:

## 2021-08-23 NOTE — Assessment & Plan Note (Signed)
Deteriorated.  Pt admits to being depressed for 'over a year' but not wanting to talk about it.  She is open to idea of adjusting medication and counseling but wants to do counseling in person and with a young, black, female.  Mom states she has someone in mind and they will reach out.  As pt likely carries the dx of bipolar disorder will add Seroquel to her current dose of Venlafaxine and monitor closely.  Pt expressed understanding and is in agreement w/ plan.

## 2021-08-23 NOTE — Assessment & Plan Note (Signed)
New.  Mom and I have discussed this in the past but pt has never shared our concerns.  Today she feels that this may be an accurate dx for her as she admits to widely fluctuating mood swings, angry outburst, and a lack of control over her emotions.  She also has bouts of deep depression.  She feels better when she remember to take her Venlafaxine daily but she needs additional tx.  Given her major depressive disorder and possibility of bipolar, will start Seroquel 50mg  BID and titrate to 100mg  BID.  Will place referral for psychiatry given her possible dx in the setting of previous TBI.  Pt expressed understanding and is in agreement w/ plan. Total time spent w/ pt ~45 minutes, >50% spent counseling

## 2021-08-23 NOTE — Patient Instructions (Signed)
Follow up in 2-3 weeks to recheck mood We'll call you with your psychiatry referral CONTINUE the Venlafaxine daily START the Seroquel twice daily- start w/ a half tab and then increase to a whole tab CALL and set up a counseling appointment I AM SO PROUD OF YOU!! CRUSH THAT TEST TOMORROW!!!

## 2021-08-27 ENCOUNTER — Ambulatory Visit (INDEPENDENT_AMBULATORY_CARE_PROVIDER_SITE_OTHER): Payer: 59 | Admitting: *Deleted

## 2021-08-27 DIAGNOSIS — Z0000 Encounter for general adult medical examination without abnormal findings: Secondary | ICD-10-CM | POA: Diagnosis not present

## 2021-08-27 NOTE — Patient Instructions (Signed)
Kylie Osborn , Thank you for taking time to come for your Medicare Wellness Visit. I appreciate your ongoing commitment to your health goals. Please review the following plan we discussed and let me know if I can assist you in the future.   Screening recommendations/referrals: Colonoscopy: not indicated Mammogram: Education provided Bone Density: not indicated Recommended yearly ophthalmology/optometry visit for glaucoma screening and checkup Recommended yearly dental visit for hygiene and checkup  Vaccinations: Influenza vaccine: up to date Pneumococcal vaccine: not indicated Tdap vaccine: up to date Shingles vaccine: not indicated    Advanced directives: Education provided  Conditions/risks identified:   Next appointment: 10-22-2021 @ 9:00 Dr. Beverely Low   Preventative  Preventive care refers to lifestyle choices and visits with your health care provider that can promote health and wellness. What does preventive care include? A yearly physical exam. This is also called an annual well check. Dental exams once or twice a year. Routine eye exams. Ask your health care provider how often you should have your eyes checked. Personal lifestyle choices, including: Daily care of your teeth and gums. Regular physical activity. Eating a healthy diet. Avoiding tobacco and drug use. Limiting alcohol use. Practicing safe sex. Taking low-dose aspirin every day. Taking vitamin and mineral supplements as recommended by your health care provider. What happens during an annual well check? The services and screenings done by your health care provider during your annual well check will depend on your age, overall health, lifestyle risk factors, and family history of disease. Counseling  Your health care provider may ask you questions about your: Alcohol use. Tobacco use. Drug use. Emotional well-being. Home and relationship well-being. Sexual activity. Eating habits. History of falls. Memory  and ability to understand (cognition). Work and work Astronomer. Reproductive health. Screening  You may have the following tests or measurements: Height, weight, and BMI. Blood pressure. Lipid and cholesterol levels. These may be checked every 5 years, or more frequently if you are over 23 years old. Skin check. Lung cancer screening. You may have this screening every year starting at age 72 if you have a 30-pack-year history of smoking and currently smoke or have quit within the past 15 years. Fecal occult blood test (FOBT) of the stool. You may have this test every year starting at age 18. Flexible sigmoidoscopy or colonoscopy. You may have a sigmoidoscopy every 5 years or a colonoscopy every 10 years starting at age 40. Hepatitis C blood test. Hepatitis B blood test. Sexually transmitted disease (STD) testing. Diabetes screening. This is done by checking your blood sugar (glucose) after you have not eaten for a while (fasting). You may have this done every 1-3 years. Bone density scan. This is done to screen for osteoporosis. You may have this done starting at age 26. Mammogram. This may be done every 1-2 years. Talk to your health care provider about how often you should have regular mammograms. Talk with your health care provider about your test results, treatment options, and if necessary, the need for more tests. Vaccines  Your health care provider may recommend certain vaccines, such as: Influenza vaccine. This is recommended every year. Tetanus, diphtheria, and acellular pertussis (Tdap, Td) vaccine. You may need a Td booster every 10 years. Zoster vaccine. You may need this after age 74. Pneumococcal 13-valent conjugate (PCV13) vaccine. One dose is recommended after age 57. Pneumococcal polysaccharide (PPSV23) vaccine. One dose is recommended after age 23. Talk to your health care provider about which screenings and vaccines you need and  how often you need them. This  information is not intended to replace advice given to you by your health care provider. Make sure you discuss any questions you have with your health care provider. Document Released: 12/08/2015 Document Revised: 07/31/2016 Document Reviewed: 09/12/2015 Elsevier Interactive Patient Education  2017 [employer] Prevention in the Home Falls can cause injuries. They can happen to people of all ages. There are many things you can do to make your home safe and to help prevent falls. What can I do on the outside of my home? Regularly fix the edges of walkways and driveways and fix any cracks. Remove anything that might make you trip as you walk through a door, such as a raised step or threshold. Trim any bushes or trees on the path to your home. Use bright outdoor lighting. Clear any walking paths of anything that might make someone trip, such as rocks or tools. Regularly check to see if handrails are loose or broken. Make sure that both sides of any steps have handrails. Any raised decks and porches should have guardrails on the edges. Have any leaves, snow, or ice cleared regularly. Use sand or salt on walking paths during winter. Clean up any spills in your garage right away. This includes oil or grease spills. What can I do in the bathroom? Use night lights. Install grab bars by the toilet and in the tub and shower. Do not use towel bars as grab bars. Use non-skid mats or decals in the tub or shower. If you need to sit down in the shower, use a plastic, non-slip stool. Keep the floor dry. Clean up any water that spills on the floor as soon as it happens. Remove soap buildup in the tub or shower regularly. Attach bath mats securely with double-sided non-slip rug tape. Do not have throw rugs and other things on the floor that can make you trip. What can I do in the bedroom? Use night lights. Make sure that you have a light by your bed that is easy to reach. Do not use any sheets or  blankets that are too big for your bed. They should not hang down onto the floor. Have a firm chair that has side arms. You can use this for support while you get dressed. Do not have throw rugs and other things on the floor that can make you trip. What can I do in the kitchen? Clean up any spills right away. Avoid walking on wet floors. Keep items that you use a lot in easy-to-reach places. If you need to reach something above you, use a strong step stool that has a grab bar. Keep electrical cords out of the way. Do not use floor polish or wax that makes floors slippery. If you must use wax, use non-skid floor wax. Do not have throw rugs and other things on the floor that can make you trip. What can I do with my stairs? Do not leave any items on the stairs. Make sure that there are handrails on both sides of the stairs and use them. Fix handrails that are broken or loose. Make sure that handrails are as long as the stairways. Check any carpeting to make sure that it is firmly attached to the stairs. Fix any carpet that is loose or worn. Avoid having throw rugs at the top or bottom of the stairs. If you do have throw rugs, attach them to the floor with carpet tape. Make sure that you have  a light switch at the top of the stairs and the bottom of the stairs. If you do not have them, ask someone to add them for you. What else can I do to help prevent falls? Wear shoes that: Do not have high heels. Have rubber bottoms. Are comfortable and fit you well. Are closed at the toe. Do not wear sandals. If you use a stepladder: Make sure that it is fully opened. Do not climb a closed stepladder. Make sure that both sides of the stepladder are locked into place. Ask someone to hold it for you, if possible. Clearly mark and make sure that you can see: Any grab bars or handrails. First and last steps. Where the edge of each step is. Use tools that help you move around (mobility aids) if they are  needed. These include: Canes. Walkers. Scooters. Crutches. Turn on the lights when you go into a dark area. Replace any light bulbs as soon as they burn out. Set up your furniture so you have a clear path. Avoid moving your furniture around. If any of your floors are uneven, fix them. If there are any pets around you, be aware of where they are. Review your medicines with your doctor. Some medicines can make you feel dizzy. This can increase your chance of falling. Ask your doctor what other things that you can do to help prevent falls. This information is not intended to replace advice given to you by your health care provider. Make sure you discuss any questions you have with your health care provider. Document Released: 09/07/2009 Document Revised: 04/18/2016 Document Reviewed: 12/16/2014 Elsevier Interactive Patient Education  2017 [employer].

## 2021-08-27 NOTE — Progress Notes (Signed)
Subjective:   Kylie Osborn is a 25 y.o. female who presents for Medicare Annual (Subsequent) preventive examination.  I connected with  Kylie Osborn on 08/27/21 by a telephone enabled telemedicine application and verified that I am speaking with the correct person using two identifiers.   I discussed the limitations of evaluation and management by telemedicine. The patient expressed understanding and agreed to proceed.   Review of Systems           Objective:    Today's Vitals   There is no height or weight on file to calculate BMI.  Advanced Directives 08/27/2021 01/08/2019 01/20/2018 01/01/2018 12/25/2017 12/11/2017 11/24/2017  Does Patient Have a Medical Advance Directive? No Yes Yes Yes Yes Yes Yes  Type of Advance Directive - - Personal assistant Power of [place]  Does patient want to make changes to medical advance directive? - No - Patient declined - - - - -  Copy of [place] in Chart? - - No - copy requested No - copy requested No - copy requested No - copy requested -  Would patient like information on creating a medical advance directive? No - Patient declined - - - - - -    Current Medications (verified) Outpatient Encounter Medications as of 08/27/2021  Medication Sig   aspirin 325 MG tablet Take 325 mg by mouth daily.   baclofen (LIORESAL) 10 MG tablet Take 0.5 tablets (5 mg total) by mouth 3 (three) times daily.   meloxicam (MOBIC) 15 MG tablet TAKE 1 TABLET BY MOUTH EVERY DAY AS NEEDED FOR PAIN   QUEtiapine (SEROQUEL) 100 MG tablet 25m (1/2 tab) twice daily for 1 week and then increase to 1 tab twice daily   venlafaxine XR (EFFEXOR-XR) 150 MG 24 hr capsule Take 1 capsule (150 mg total) by mouth daily with breakfast.   Facility-Administered Encounter Medications as of 08/27/2021  Medication   gadopentetate dimeglumine (MAGNEVIST) injection 20  mL    Allergies (verified) Patient has no known allergies.   History: Past Medical History:  Diagnosis Date   Strep pharyngitis 10/22/2015   TBI (traumatic brain injury) 04/06/2017   Past Surgical History:  Procedure Laterality Date   ESOPHAGOGASTRODUODENOSCOPY N/A 04/16/2017   Procedure: ESOPHAGOGASTRODUODENOSCOPY (EGD);  Surgeon: WJudeth Horn MD;  [facility];  Service: General;  Laterality: N/A;   LACERATION REPAIR N/A 04/08/2017   Procedure: SCALP LACERATION REPAIR;  Surgeon: DMichael Litter DMD;  [facility]  Service: Oral Surgery;  Laterality: N/A;   ORIF ULNAR FRACTURE Left 04/08/2017   Procedure: OPEN REDUCTION INTERNAL FIXATION (ORIF) ULNAR FRACTURE and RADIAL FRACTURE;  Surgeon: MRenette Butters MD;  [facility]  Service: Orthopedics;  Laterality: Left;   PEG PLACEMENT N/A 04/16/2017   Procedure: PERCUTANEOUS ENDOSCOPIC GASTROSTOMY (PEG) PLACEMENT;  Surgeon: WJudeth Horn MD;  [facility]  Service: General;  Laterality: N/A;   PERCUTANEOUS TRACHEOSTOMY N/A 04/16/2017   Procedure: PERCUTANEOUS TRACHEOSTOMY;  Surgeon: WJudeth Horn MD;  [facility]  Service: General;  Laterality: N/A;   POSTERIOR CERVICAL FUSION/FORAMINOTOMY N/A 04/06/2017   Procedure: C2- C4 LAMINECTOMY FOR DECOMPRESSION, OPEN REDUCTION OF DISPLACED FRACTURE , C2- C5 POSTERIOR FIXATION FUSION;  Surgeon: [name], BKevan Ny MD;  [facility]  Service: Neurosurgery;  Laterality: N/A;   Family History  Problem Relation Age of Onset   Hypertension Mother    Liver disease Father  Diabetes Maternal Grandmother    Hypertension Maternal Grandfather    Social History   Socioeconomic History   Marital status: Single    Spouse name: Not on file   Number of children: 0   Years of education: college student   Highest education level: Not on file  Occupational History   Not on file  Tobacco Use   Smoking status: Never   Smokeless tobacco: Never  Vaping Use   Vaping  Use: Never used  Substance and Sexual Activity   Alcohol use: No   Drug use: No   Sexual activity: Yes  Other Topics Concern   Not on file  Social History Narrative   ** Merged History Encounter **       Lives at home with parents. 1 cup caffeine per day. Ambidextrous.    Social Determinants of Health   Financial Resource Strain: Low Risk    Difficulty of Paying Living Expenses: Not hard at all  Food Insecurity: No Food Insecurity   Worried About Charity fundraiser in the Last Year: Never true   [place] in the Last Year: Never true  Transportation Needs: No Transportation Needs   Lack of Transportation (Medical): No   Lack of Transportation (Non-Medical): No  Physical Activity: Insufficiently Active   Days of Exercise per Week: 3 days   Minutes of Exercise per Session: 40 min  Stress: Stress Concern Present   Feeling of Stress : Rather much  Social Connections: Socially Isolated   Frequency of Communication with Friends and Family: More than three times a week   Frequency of Social Gatherings with Friends and Family: Twice a week   Attends Religious Services: Never   Marine scientist or Organizations: No   Attends Music therapist: Never   Marital Status: Never married    Tobacco Counseling Counseling given: Not Answered   Clinical Intake:  Pre-visit preparation completed: Yes  Pain : No/denies pain     Nutritional Risks: None Diabetes: No  How often do you need to have someone help you when you read instructions, pamphlets, or other written materials from your doctor or pharmacy?: 1 - Never  Diabetic?no  Interpreter Needed?: No  Information entered by :: Kylie Kennedy LPN   Activities of Daily Living In your present state of health, do you have any difficulty performing the following activities: 08/27/2021 05/07/2021  Hearing? N N  Vision? N N  Difficulty concentrating or making decisions? N N  Walking or climbing stairs? N  N  Dressing or bathing? N N  Doing errands, shopping? N N  Preparing Food and eating ? N -  Using the Toilet? N -  In the past six months, have you accidently leaked urine? N -  Do you have problems with loss of bowel control? N -  Managing your Medications? N -  Managing your Finances? N -  Some recent data might be hidden    Patient Care Team: Midge Minium, MD as PCP - General  Indicate any recent Medical Services you may have received from other than [facility] providers in the past year (date may be approximate).     Assessment:   This is a routine wellness examination for [place].  Hearing/Vision screen Hearing Screening - Comments:: No hearing / issues Vision Screening - Comments:: No trouble seeing  Dietary issues and exercise activities discussed: Current Exercise Habits: Home exercise routine, Type of exercise: strength training/weights (indoor bicycle), Time (Minutes): 40,  Frequency (Times/Week): 3, Weekly Exercise (Minutes/Week): 120, Intensity: Mild   Goals Addressed             This Visit's Progress    Patient Stated       Would like to gain 10 lbs        Depression Screen PHQ 2/9 Scores 08/27/2021 08/23/2021 08/17/2021 05/07/2021 04/09/2021 11/03/2020 09/22/2019  PHQ - 2 Score 2 2 1  0 6 2 0  PHQ- 9 Score 5 5 3  0 14 8 -    Fall Risk Fall Risk  08/27/2021 08/23/2021 08/17/2021 05/07/2021 04/09/2021  Falls in the past year? 0 0 0 0 0  Number falls in past yr: 0 - - 0 0  Injury with Fall? 0 - - 0 0  Risk for fall due to : - No Fall Risks No Fall Risks No Fall Risks No Fall Risks  Follow up Falls evaluation completed;Falls prevention discussed Falls evaluation completed Falls evaluation completed - -    FALL RISK PREVENTION PERTAINING TO THE HOME:  Any stairs in or around the home? Yes  If so, are there any without handrails? No  Home free of loose throw rugs in walkways, pet beds, electrical cords, etc? Yes  Adequate lighting in your home to reduce risk of  falls? Yes   ASSISTIVE DEVICES UTILIZED TO PREVENT FALLS:  Life alert? No  Use of a cane, walker or w/c? No  Grab bars in the bathroom? No  Shower chair or bench in shower? No  Elevated toilet seat or a handicapped toilet? No   TIMED UP AND GO:  Was the test performed? No .    Cognitive Function:  Normal cognitive status assessed by direct observation by this Nurse Health Advisor. No abnormalities found.        Immunizations Immunization History  Administered Date(s) Administered   DTP 04/26/1997, 06/14/1997, 08/16/1997, 02/22/1998, 08/31/2002   H1N1 11/16/2008   HPV Quadrivalent 05/22/2011   Hepatitis B 1996/06/23, 03/07/1997, 08/16/1997   HiB (PRP-OMP) 04/26/1997, 06/14/1997, 02/22/1998   Hpv-Unspecified 11/16/2010, 01/16/2011   Influenza,inj,Quad PF,6+ Mos 10/27/2014, 09/11/2017, 11/20/2018, 09/22/2019, 08/23/2021   MMR 02/22/1998, 08/31/2002   Meningococcal Conjugate 06/17/2013   OPV 04/26/1997, 06/14/1997, 02/22/1998, 08/31/2002   PFIZER(Purple Top)SARS-COV-2 Vaccination 03/25/2020, 04/17/2020, 10/30/2020   PPD Test 09/07/2016, 05/18/2019   Td 07/15/2008   Tdap 04/06/2017    TDAP status: Up to date  Flu Vaccine status: Up to date  Pneumococcal vaccine status: Declined,  Education has been provided regarding the importance of this vaccine but patient still declined. Advised may receive this vaccine at local pharmacy or Health Dept. Aware to provide a copy of the vaccination record if obtained from local pharmacy or Health Dept. Verbalized acceptance and understanding.   Covid-19 vaccine status: Information provided on how to obtain vaccines.   Qualifies for Shingles Vaccine? No   Zostavax completed No   Not indicated  Screening Tests Health Maintenance  Topic Date Due   Hepatitis C Screening  04/09/2022 (Originally 01/29/2015)   PAP-Cervical Cytology Screening  07/05/2022   PAP SMEAR-Modifier  07/05/2022   TETANUS/TDAP  04/07/2027   INFLUENZA VACCINE   Completed   HPV VACCINES  Completed   COVID-19 Vaccine  Completed   HIV Screening  Completed    Health Maintenance  There are no preventive care reminders to display for this patient.  Colorectal cancer screening: No longer required.   Mammogram status: Ordered  . Pt provided with contact info and advised to call to schedule appt.  Bone Density  Not indicated  Lung Cancer Screening: (Low Dose CT Chest recommended if Age 59-80 years, 30 pack-year currently smoking OR have quit w/in 15years.) does not qualify.   Lung Cancer Screening Referral:   Additional Screening:  Hepatitis C Screening: does not qualify;  Not indicated  Vision Screening: Recommended annual ophthalmology exams for early detection of glaucoma and other disorders of the eye. Is the patient up to date with their annual eye exam?  No  Who is the provider or what is the name of the office in which the patient attends annual eye exams?  If pt is not established with a provider, would they like to be referred to a provider to establish care? No .   Dental Screening: Recommended annual dental exams for proper oral hygiene  Community Resource Referral / Chronic Care Management: CRR required this visit?  No   CCM required this visit?  No      Plan:     I have personally reviewed and noted the following in the patient's chart:   Medical and social history Use of alcohol, tobacco or illicit drugs  Current medications and supplements including opioid prescriptions.  Functional ability and status Nutritional status Physical activity Advanced directives List of other physicians Hospitalizations, surgeries, and ER visits in previous 12 months Vitals Screenings to include cognitive, depression, and falls Referrals and appointments  In addition, I have reviewed and discussed with patient certain preventive protocols, quality metrics, and best practice recommendations. A written personalized care plan for  preventive services as well as general preventive health recommendations were provided to patient.     Kylie Kennedy, LPN   16/11/958   Nurse Notes:   Patient wanted to let Dr. Birdie Riddle  know that she passed her test

## 2021-08-28 ENCOUNTER — Encounter: Payer: Self-pay | Admitting: Family Medicine

## 2021-09-13 ENCOUNTER — Other Ambulatory Visit: Payer: Self-pay | Admitting: Family Medicine

## 2021-09-17 ENCOUNTER — Other Ambulatory Visit: Payer: Self-pay | Admitting: Family

## 2021-09-18 ENCOUNTER — Telehealth: Payer: Self-pay

## 2021-09-18 NOTE — Telephone Encounter (Signed)
Once we have a better understanding of what she needs, i'll be happy to help

## 2021-09-18 NOTE — Telephone Encounter (Signed)
Called and spoke with patient and patient stated that her employer needs a letter stating the patients medical disability. I asked patient if she was given a form and patient stated that she did not but will ask her employer on today to clarify exactly what she needs. Patient does have an appt with Janeece Agee tomorrow.

## 2021-09-18 NOTE — Telephone Encounter (Signed)
Caller name:Kylie Osborn   On DPR? :yes   Call back [phone]  Provider they see: Beverely Low   Reason for call:Patient is calling wanting to know if Dr Beverely Low can write up a letter proving her Medical disability her job is wanting a Medical Certificate? She don't have nothing to give to her employer and she is unclear as to what she is asking for regarding the disability form.

## 2021-09-18 NOTE — Telephone Encounter (Signed)
Will update as soon as I hear back from patient.

## 2021-09-19 ENCOUNTER — Ambulatory Visit: Payer: 59 | Admitting: Registered Nurse

## 2021-09-19 ENCOUNTER — Encounter: Payer: Self-pay | Admitting: Registered Nurse

## 2021-09-19 ENCOUNTER — Telehealth (INDEPENDENT_AMBULATORY_CARE_PROVIDER_SITE_OTHER): Payer: 59 | Admitting: Registered Nurse

## 2021-09-19 ENCOUNTER — Other Ambulatory Visit: Payer: Self-pay

## 2021-09-19 DIAGNOSIS — N390 Urinary tract infection, site not specified: Secondary | ICD-10-CM

## 2021-09-19 LAB — POCT URINALYSIS DIP (MANUAL ENTRY)
Bilirubin, UA: NEGATIVE
Glucose, UA: NEGATIVE mg/dL
Ketones, POC UA: NEGATIVE mg/dL
Nitrite, UA: POSITIVE — AB
Protein Ur, POC: 100 mg/dL — AB
Spec Grav, UA: >=1.03 — AB (ref 1.010–1.025)
Urobilinogen, UA: 0.2 E.U./dL
pH, UA: 6 (ref 5.0–8.0)

## 2021-09-19 MED ORDER — SULFAMETHOXAZOLE-TRIMETHOPRIM 800-160 MG PO TABS: 1.0000 | ORAL_TABLET | Freq: Two times a day (BID) | ORAL | 0 refills | Status: DC
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
## 2021-09-20 ENCOUNTER — Other Ambulatory Visit: Payer: Self-pay | Admitting: Family Medicine

## 2021-09-20 NOTE — Telephone Encounter (Signed)
Request for 90 days.

## 2021-10-09 ENCOUNTER — Other Ambulatory Visit: Payer: Self-pay | Admitting: Family Medicine

## 2021-10-11 ENCOUNTER — Other Ambulatory Visit: Payer: Self-pay | Admitting: Family Medicine

## 2021-10-16 ENCOUNTER — Ambulatory Visit (HOSPITAL_COMMUNITY): Payer: Self-pay | Admitting: Psychiatry

## 2021-10-22 ENCOUNTER — Encounter: Payer: Self-pay | Admitting: Family Medicine

## 2021-10-22 ENCOUNTER — Other Ambulatory Visit: Payer: Self-pay

## 2021-10-22 ENCOUNTER — Ambulatory Visit (INDEPENDENT_AMBULATORY_CARE_PROVIDER_SITE_OTHER): Payer: 59 | Admitting: Family Medicine

## 2021-10-22 VITALS — BP 110/60 | HR 84 | Temp 97.6°F | Resp 16 | Wt 129.8 lb

## 2021-10-22 DIAGNOSIS — F3181 Bipolar II disorder: Secondary | ICD-10-CM | POA: Diagnosis not present

## 2021-10-22 MED ORDER — MELOXICAM 15 MG PO TABS: ORAL_TABLET | ORAL | 1 refills | Status: DC

## 2021-10-22 MED ORDER — QUETIAPINE FUMARATE 50 MG PO TABS: 50.0000 mg | ORAL_TABLET | Freq: Two times a day (BID) | ORAL | 3 refills | Status: DC

## 2021-10-22 NOTE — Patient Instructions (Signed)
Follow up in 6 months to recheck mood CONTINUE the Quetiapine twice daily- 1/2 tab of what you have at home and then a full tab when you pick up the new medication Call with any questions or concerns Stay Safe!  Stay Healthy! Happy Holidays!!!

## 2021-10-22 NOTE — Assessment & Plan Note (Signed)
Pt started Seroquel at last visit.  Notes a big improvement in anger, patience, mood.  Is currently on 50mg  BID due to sedation.  Will continue that dose as it is helping.  Prescription for 50mg  BID sent to pharmacy.  Pt expressed understanding and is in agreement w/ plan.

## 2021-10-22 NOTE — Progress Notes (Signed)
   Subjective:    Patient ID: Kylie Osborn, female    DOB: 09/11/1996, 25 y.o.   MRN: [id number]  HPI Bipolar II- pt was started on Seroquel at last visit.  Pt is taking 1/2 tab twice daily.  Did not increase to 1 tab due to sedation.  Pt feels that current dose is manageable.  Pt feels mood is better.  Feels more in control.  Pt has psych appt upcoming but not until 1/3.     Review of Systems For ROS see HPI   This visit occurred during the SARS-CoV-2 public health emergency.  Safety protocols were in place, including screening questions prior to the visit, additional usage of staff PPE, and extensive cleaning of exam room while observing appropriate contact time as indicated for disinfecting solutions.      Objective:   Physical Exam Vitals reviewed.  Constitutional:      General: She is not in acute distress.    Appearance: Normal appearance. She is not ill-appearing.  HENT:     Head: Normocephalic and atraumatic.  Eyes:     Extraocular Movements: Extraocular movements intact.     Conjunctiva/sclera: Conjunctivae normal.     Pupils: Pupils are equal, round, and reactive to light.  Skin:    General: Skin is warm and dry.  Neurological:     Mental Status: She is alert.  Psychiatric:        Mood and Affect: Mood normal.        Behavior: Behavior normal.        Thought Content: Thought content normal.          Assessment & Plan:

## 2021-11-21 ENCOUNTER — Other Ambulatory Visit: Payer: Self-pay | Admitting: Family Medicine

## 2021-11-27 ENCOUNTER — Ambulatory Visit (INDEPENDENT_AMBULATORY_CARE_PROVIDER_SITE_OTHER): Payer: Medicare Other | Admitting: Psychiatry

## 2021-11-27 ENCOUNTER — Encounter (HOSPITAL_COMMUNITY): Payer: Self-pay | Admitting: Psychiatry

## 2021-11-27 VITALS — BP 110/72 | HR 94 | Temp 97.6°F | Ht 69.25 in | Wt 134.6 lb

## 2021-11-27 DIAGNOSIS — F331 Major depressive disorder, recurrent, moderate: Secondary | ICD-10-CM

## 2021-11-27 DIAGNOSIS — F411 Generalized anxiety disorder: Secondary | ICD-10-CM

## 2021-11-27 DIAGNOSIS — F0630 Mood disorder due to known physiological condition, unspecified: Secondary | ICD-10-CM
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
## 2021-12-18 ENCOUNTER — Ambulatory Visit (INDEPENDENT_AMBULATORY_CARE_PROVIDER_SITE_OTHER): Payer: Medicare Other | Admitting: Licensed Clinical Social Worker

## 2021-12-18 ENCOUNTER — Encounter (HOSPITAL_COMMUNITY): Payer: Self-pay

## 2021-12-18 DIAGNOSIS — F411 Generalized anxiety disorder: Secondary | ICD-10-CM | POA: Diagnosis not present

## 2021-12-18 DIAGNOSIS — F331 Major depressive disorder, recurrent, moderate: Secondary | ICD-10-CM

## 2021-12-18 NOTE — Plan of Care (Signed)
°  Problem: Depression CCP Problem  1 Regulating mood Goal: Kylie Osborn will manage mood as evidenced by self regulating mood and anxiety, regulating sleep, coping with grief/loss, and improving communication with people she is close to for 5 out of 7 days for 60 days.  Outcome: Not Progressing Goal: STG: Kylie Osborn WILL IDENTIFY 4 COGNITIVE PATTERNS AND BELIEFS THAT SUPPORT DEPRESSION Outcome: Not Progressing
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
## 2021-12-29 ENCOUNTER — Other Ambulatory Visit: Payer: Self-pay | Admitting: Family Medicine

## 2022-01-08 ENCOUNTER — Ambulatory Visit (INDEPENDENT_AMBULATORY_CARE_PROVIDER_SITE_OTHER): Payer: Medicare Other | Admitting: Licensed Clinical Social Worker

## 2022-01-08 DIAGNOSIS — F331 Major depressive disorder, recurrent, moderate: Secondary | ICD-10-CM

## 2022-01-08 NOTE — Progress Notes (Addendum)
° °  THERAPIST PROGRESS NOTE  Session Time: 10:00 am-10:45 am  Type of Therapy: Individual Therapy  Session #1  Purpose of Session: Kylie Osborn will manage mood as evidenced by self regulating mood and anxiety, regulating sleep, coping with grief/loss, and improving communication with people she is close to for 5 out of 7 days for 60 days.   ProgressTowards Goals: Initial  Interventions: Therapist utilized CBT and Solution focused brief therapy to address mood and anxiety. Therapist provided support and empathy to patient during session. Therapist had patient identify what has gone well since last session. Therapist provided information on sleep hygiene. Therapist provided patient with a sleep log as well.   Effectiveness: Patient was oriented x5 (person, place, situation, time, and object). Patient was casually dressed, and appropriately groomed. Patient was alert, engaged, pleasant, and cooperative. Patient had a down week after the last session but has stabilized. Patient noted that she has gotten job offers. She will be starting a new job. The commute will be an hour and a half. She is worried about being able to get to her job on time due to her sleep schedule. Patient is going to sleep at 4 or 5 am. Patient is going to work on moving her sleep schedule back by 15 minutes every few days. She hopes to get her sleep schedule back to around 9 pm or 10 pm. Patient was provided information on sleep hygiene.   Patient was engaged in session. She responded well to interventions. Patient continues to meet criteria for Major Depressive disorder, recurrent, moderate and Generalized Anxiety Disorder. Patient will continue in outpatient therapy due to being the least restrictive service to meet her needs. Patient made minimal progress on her goals at this time.   Suicidal/Homicidal: Negativewithout intent/plan  Plan: Return again in 2-4 weeks.  Diagnosis: MDD (major depressive disorder), recurrent episode,  moderate (HCC)  Collaboration of Care: Medication Management AEB Dr. Thresa Ross  Patient/Guardian was advised Release of Information must be obtained prior to any record release in order to collaborate their care with an outside provider. Patient/Guardian was advised if they have not already done so to contact the registration department to sign all necessary forms in order for [place] to release information regarding their care.   Consent: Patient/Guardian gives verbal consent for treatment and assignment of benefits for services provided during this visit. Patient/Guardian expressed understanding and agreed to proceed.   Bynum Bellows, LCSW 01/08/2022

## 2022-01-15 ENCOUNTER — Ambulatory Visit (HOSPITAL_COMMUNITY): Payer: Medicare Other | Admitting: Psychiatry

## 2022-01-22 ENCOUNTER — Ambulatory Visit (INDEPENDENT_AMBULATORY_CARE_PROVIDER_SITE_OTHER): Payer: Medicare Other | Admitting: Psychiatry

## 2022-01-22 ENCOUNTER — Encounter (HOSPITAL_COMMUNITY): Payer: Self-pay | Admitting: Psychiatry

## 2022-01-22 VITALS — BP 98/68 | Temp 98.6°F | Ht 69.25 in | Wt 138.0 lb

## 2022-01-22 DIAGNOSIS — F0630 Mood disorder due to known physiological condition, unspecified: Secondary | ICD-10-CM

## 2022-01-22 DIAGNOSIS — F411 Generalized anxiety disorder: Secondary | ICD-10-CM | POA: Diagnosis not present

## 2022-01-22 DIAGNOSIS — F331 Major depressive disorder, recurrent, moderate: Secondary | ICD-10-CM | POA: Diagnosis not present
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
## 2022-01-23 ENCOUNTER — Ambulatory Visit (INDEPENDENT_AMBULATORY_CARE_PROVIDER_SITE_OTHER): Payer: Medicare Other | Admitting: Licensed Clinical Social Worker

## 2022-01-23 DIAGNOSIS — F331 Major depressive disorder, recurrent, moderate: Secondary | ICD-10-CM

## 2022-01-24 NOTE — Progress Notes (Signed)
? ?  THERAPIST PROGRESS NOTE ? ?Session Time: 9:00 am-9:45 am ? ?Type of Therapy: Individual Therapy ? ?Session #2 ? ?Purpose of Session: Kylie Osborn will manage mood as evidenced by self regulating mood and anxiety, regulating sleep, coping with grief/loss, and improving communication with people she is close to for 5 out of 7 days for 60 days.  ? ?ProgressTowards Goals: Progressing ? ?Interventions: Therapist utilized CBT and Solution focused brief therapy to address mood and anxiety. Therapist provided support and empathy to patient during session. Therapist had patient identify what has improved since last session. Therapist worked with patient to identify patterns in communications and ways to express her emotions to those she is close with.  ? ?Effectiveness: Patient was oriented x5 (person, place, situation, time, and object). Patient was alert, engaged, pleasant, and cooperative. Patient was casually dressed, and appropriately groomed. Patient noted that things have gotten better. She is feeling less depressed, and anxious. Patient is trying to get outside more and walk the dogs. She is trying to go to bed earlier. She is starting a job next week. She is still experiencing anxiety at times but is able to manage it. Patient shared an experience where she was able to communicate with her mother and express that her mother asking repeated questions about her job was starting to annoy her. They both ended up laughing rather than it escalating. Patient was provided information and a handout on I statements to better communicate her feelings.  ? ?Patient engaged in session. She responded well to interventions. Patient continues to meet criteria for Major Depressive disorder, recurrent, moderate and Generalized Anxiety Disorder. Patient will continue in outpatient therapy due to being the least restrictive service to meet her needs. Patient made minimal progress on her goals at this time.  ? ?Suicidal/Homicidal:  Negativewithout intent/plan ? ?Plan: Return again in 2-4 weeks. Patient will use I statements 3 times over the next week. ? ?Diagnosis: MDD (major depressive disorder), recurrent episode, moderate (HCC) ? ?Collaboration of Care: Medication Management AEB Dr. Thresa Ross ? ?Patient/Guardian was advised Release of Information must be obtained prior to any record release in order to collaborate their care with an outside provider. Patient/Guardian was advised if they have not already done so to contact the registration department to sign all necessary forms in order for [place] to release information regarding their care.  ? ?Consent: Patient/Guardian gives verbal consent for treatment and assignment of benefits for services provided during this visit. Patient/Guardian expressed understanding and agreed to proceed.  ? ?Bynum Bellows, LCSW ?01/24/2022 ? ?

## 2022-01-29 ENCOUNTER — Ambulatory Visit (HOSPITAL_COMMUNITY): Payer: Medicare Other | Admitting: Licensed Clinical Social Worker

## 2022-02-19 ENCOUNTER — Ambulatory Visit (INDEPENDENT_AMBULATORY_CARE_PROVIDER_SITE_OTHER): Payer: Medicare Other | Admitting: Licensed Clinical Social Worker

## 2022-02-19 DIAGNOSIS — F331 Major depressive disorder, recurrent, moderate: Secondary | ICD-10-CM

## 2022-02-19 NOTE — Progress Notes (Signed)
? ?  THERAPIST PROGRESS NOTE ? ?Session Time: 11:17 am-11:47 am ? ?Type of Therapy: Individual Therapy ? ?Session #3 ? ?Purpose of Session: Yadira will manage mood as evidenced by self regulating mood and anxiety, regulating sleep, coping with grief/loss, and improving communication with people she is close to for 5 out of 7 days for 60 days.  ? ?ProgressTowards Goals: Progressing ? ?Interventions: Therapist utilized CBT and Solution focused brief therapy to address mood and anxiety. Therapist provided support and empathy to patient during session. Therapist worked with patient to identify the major components of a recent episode of depression: physical symptoms, major thoughts and images, and major behaviors experienced.  ? ?Effectiveness: Patient was oriented x4 (person, place, situation, and time). Patient was alert, engaged, pleasant, and cooperative. Patient was casually dressed, and appropriately groomed. Patient has started her new job. She is getting up early and is able to make it to work on time. Patient is getting used to the drive. Patient doesn't feel like she got adequate training at her job so she doesn't know exactly what she is doing. She is trying out different activities with different groups in an inpatient setting to see what works. Patient also feels like she had a pregnancy scare. She has gained weight and has tender breasts but she took a pregnancy test and it was negative. Patient doesn't have time for a child and knows that if she were pregnant by her ex that he wouldn't be a good partner. He didn't want a relationship, cheated, and lied a lot. Patient is planning on going back to school for speech pathology in the fall.  ? ?Patient engaged in session. She responded well to interventions. Patient continues to meet criteria for Major Depressive disorder, recurrent, moderate and Generalized Anxiety Disorder. Patient will continue in outpatient therapy due to being the least restrictive service  to meet her needs. Patient made moderate progress on her goals at this time.  ? ?Suicidal/Homicidal: Negativewithout intent/plan ? ?Plan: Return again in 2-4 weeks. Patient will use I statements 3 times over the next week. ? ?Diagnosis: MDD (major depressive disorder), recurrent episode, moderate (HCC) ? ?Collaboration of Care: Medication Management AEB Dr. Thresa Ross ? ?Patient/Guardian was advised Release of Information must be obtained prior to any record release in order to collaborate their care with an outside provider. Patient/Guardian was advised if they have not already done so to contact the registration department to sign all necessary forms in order for [place] to release information regarding their care.  ? ?Consent: Patient/Guardian gives verbal consent for treatment and assignment of benefits for services provided during this visit. Patient/Guardian expressed understanding and agreed to proceed.  ? ?Bynum Bellows, LCSW ?02/19/2022 ? ?

## 2022-03-13 ENCOUNTER — Ambulatory Visit (HOSPITAL_COMMUNITY): Payer: Medicare Other | Admitting: Licensed Clinical Social Worker

## 2022-03-13 ENCOUNTER — Ambulatory Visit (INDEPENDENT_AMBULATORY_CARE_PROVIDER_SITE_OTHER): Payer: Medicare Other | Admitting: Licensed Clinical Social Worker

## 2022-03-13 DIAGNOSIS — F411 Generalized anxiety disorder: Secondary | ICD-10-CM

## 2022-03-13 DIAGNOSIS — F331 Major depressive disorder, recurrent, moderate: Secondary | ICD-10-CM

## 2022-03-14 NOTE — Progress Notes (Signed)
? ?  THERAPIST PROGRESS NOTE ? ?Session Time: 10:00 am-10:45 am ? ?Type of Therapy: Individual Therapy ? ?Session #4 ? ?Purpose of Session: Kylie Osborn will manage mood as evidenced by self regulating mood and anxiety, regulating sleep, coping with grief/loss, and improving communication with people she is close to for 5 out of 7 days for 60 days.  ? ?ProgressTowards Goals: Progressing ? ?Interventions: Therapist utilized CBT and Solution focused brief therapy to address mood and anxiety. Therapist provided support and empathy to patient during session. Therapist processed patient's feelings to identify triggers for mood. Therapist had patient identify the triggers for mood that drive her frustrations.  ? ?Effectiveness: Patient was oriented x4 (person, place, situation, and time). Patient was alert, engaged, pleasant, and cooperative. Patient casually dressed, and appropriately groomed. Patient is frustrated with her job. 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 Patient is getting along with her mother, and has not been thinking about her ex. Patient is trying to get out of the house, go for walks, and enjoy the weather. Patient's mood has been stable.   ? ?Patient engaged in session. She responded well to interventions. Patient continues to meet criteria for Major Depressive disorder, recurrent, moderate and Generalized Anxiety Disorder. Patient will continue in outpatient therapy due to being the least restrictive service to meet her needs. Patient made moderate progress on her goals at this time.  ? ?Suicidal/Homicidal: Negativewithout intent/plan ? ?Plan: Return again in 2-4 weeks. Patient will sit with discomfort and negative thoughts without allowing them to take root.  ? ?Diagnosis:  MDD (major depressive disorder), recurrent episode, moderate ([place]) ? ?GAD (generalized anxiety disorder) ? ?Collaboration of Care: Medication Management AEB Dr. Merian Capron ? ?Patient/Guardian was advised Release of Information must be obtained prior to any record release in order to collaborate their care with an outside provider. Patient/Guardian was advised if they have not already done so to contact the registration department to sign all necessary forms in order for [place] to release information regarding their care.  ? ?Consent: Patient/Guardian gives verbal consent for treatment and assignment of benefits for services provided during this visit. Patient/Guardian expressed understanding and agreed to proceed.  ? ?Glori Bickers, LCSW ?03/14/2022 ? ?

## 2022-03-18 ENCOUNTER — Encounter (HOSPITAL_COMMUNITY): Payer: Self-pay | Admitting: Psychiatry

## 2022-03-18 ENCOUNTER — Ambulatory Visit (INDEPENDENT_AMBULATORY_CARE_PROVIDER_SITE_OTHER): Payer: 59 | Admitting: Family Medicine

## 2022-03-18 ENCOUNTER — Other Ambulatory Visit (HOSPITAL_COMMUNITY)
Admission: RE | Admit: 2022-03-18 | Discharge: 2022-03-18 | Disposition: A | Discharge status: Home / Self Care | Payer: Medicare Other | Source: Ambulatory Visit | Attending: Family Medicine | Admitting: Family Medicine

## 2022-03-18 ENCOUNTER — Telehealth (INDEPENDENT_AMBULATORY_CARE_PROVIDER_SITE_OTHER): Payer: Medicare Other | Admitting: Psychiatry

## 2022-03-18 ENCOUNTER — Encounter: Payer: Self-pay | Admitting: Family Medicine

## 2022-03-18 VITALS — BP 112/74 | HR 87 | Temp 97.9°F | Resp 16 | Wt 144.4 lb

## 2022-03-18 DIAGNOSIS — N39490 Overflow incontinence: Secondary | ICD-10-CM | POA: Diagnosis not present

## 2022-03-18 DIAGNOSIS — Z202 Contact with and (suspected) exposure to infections with a predominantly sexual mode of transmission: Secondary | ICD-10-CM | POA: Insufficient documentation

## 2022-03-18 DIAGNOSIS — F331 Major depressive disorder, recurrent, moderate: Secondary | ICD-10-CM | POA: Diagnosis not present

## 2022-03-18 DIAGNOSIS — F411 Generalized anxiety disorder: Secondary | ICD-10-CM | POA: Diagnosis not present

## 2022-03-18 DIAGNOSIS — Z114 Encounter for screening for human immunodeficiency virus [HIV]: Secondary | ICD-10-CM

## 2022-03-18 DIAGNOSIS — M79672 Pain in left foot: Secondary | ICD-10-CM

## 2022-03-18 NOTE — Patient Instructions (Signed)
Follow up as needed or as scheduled ?We'll notify you of your lab results and make any changes if needed ?We'll call you with your podiatry appt for the foot pain ?Just message me if you change your mind on bladder medicine or therapy ?Call with any questions or concerns ?Stay Safe!!  Stay Healthy!!! ?Happy Spring!!! ?
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
## 2022-03-19 LAB — HIV ANTIBODY (ROUTINE TESTING W REFLEX): HIV 1&2 Ab, 4th Generation: NONREACTIVE

## 2022-03-19 LAB — URINE CYTOLOGY ANCILLARY ONLY
Chlamydia: NEGATIVE
Comment: NEGATIVE
Comment: NEGATIVE
Comment: NORMAL
Neisseria Gonorrhea: NEGATIVE
Trichomonas: NEGATIVE

## 2022-03-19 LAB — RPR: RPR Ser Ql: NONREACTIVE

## 2022-04-01 ENCOUNTER — Ambulatory Visit (INDEPENDENT_AMBULATORY_CARE_PROVIDER_SITE_OTHER): Payer: Medicare Other | Admitting: Podiatry

## 2022-04-01 ENCOUNTER — Ambulatory Visit (INDEPENDENT_AMBULATORY_CARE_PROVIDER_SITE_OTHER): Payer: Medicare Other

## 2022-04-01 DIAGNOSIS — M84375A Stress fracture, left foot, initial encounter for fracture: Secondary | ICD-10-CM | POA: Diagnosis not present

## 2022-04-01 DIAGNOSIS — M7750 Other enthesopathy of unspecified foot: Secondary | ICD-10-CM

## 2022-04-01 DIAGNOSIS — M7752 Other enthesopathy of left foot: Secondary | ICD-10-CM | POA: Diagnosis not present

## 2022-04-01 MED ORDER — MELOXICAM 15 MG PO TABS: ORAL_TABLET | ORAL | 1 refills | Status: AC

## 2022-04-01 MED ORDER — METHYLPREDNISOLONE 4 MG PO TBPK: ORAL_TABLET | ORAL | 0 refills | Status: DC

## 2022-04-03 ENCOUNTER — Ambulatory Visit (INDEPENDENT_AMBULATORY_CARE_PROVIDER_SITE_OTHER): Payer: Medicare Other | Admitting: Licensed Clinical Social Worker

## 2022-04-03 DIAGNOSIS — F411 Generalized anxiety disorder: Secondary | ICD-10-CM

## 2022-04-03 NOTE — Progress Notes (Signed)
? ?  THERAPIST PROGRESS NOTE ? ?Session Time: 11:00 am-11:45 am ? ?Type of Therapy: Individual Therapy ? ?Session #5 ? ?Purpose of Session: Payton will manage mood as evidenced by self regulating mood and anxiety, regulating sleep, coping with grief/loss, and improving communication with people she is close to for 5 out of 7 days for 60 days.  ? ?ProgressTowards Goals: Progressing ? ?Interventions: Therapist utilized CBT and Solution focused brief therapy to address mood and anxiety. Therapist provided support and empathy to patient during session. Therapist explored patient's triggers and worked with patient to problem solve to find what she can focus on.  ? ?Effectiveness: Patient was oriented x4 (person, place, situation, and time). Patient was casually dressed, and appropriately groomed. Patient was alert, engaged, pleasant, and cooperative. Patient is worried about her grandfather. He is back in the hospital with a collapsed lung. The last time he was in the hospital for the same reason nothing was found and he was discharged. She is spending her free time at the hospital with her grandfather so that he isn't alone. 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 Patient noted that work is going fine for her. She doesn't feel like she has much time for herself. Patient needs to research schools and apply for new jobs. She is unable to do that now. She is going to focus her grandfather and when he is better work on those tasks as well as doing things for herself.  ? ?Patient engaged in session. She responded well to interventions. Patient continues to meet criteria for Major Depressive disorder, recurrent, moderate and Generalized Anxiety Disorder. Patient will continue in outpatient therapy due to being the least restrictive service to meet her needs. Patient made moderate progress on her goals at this time.  ? ?Suicidal/Homicidal: Negativewithout intent/plan ? ?Plan: Return again in 2-4  weeks. Patient will focus on what she can control with her anxiety and then work on finding a school to attend and apply for new jobs.   ? ?Diagnosis: GAD (generalized anxiety disorder) ? ?Collaboration of Care: Medication Management AEB Dr. Thresa Ross ? ?Patient/Guardian was advised Release of Information must be obtained prior to any record release in order to collaborate their care with an outside provider. Patient/Guardian was advised if they have not already done so to contact the registration department to sign all necessary forms in order for [place] to release information regarding their care.  ? ?Consent: Patient/Guardian gives verbal consent for treatment and assignment of benefits for services provided during this visit. Patient/Guardian expressed understanding and agreed to proceed.  ? ?Bynum Bellows, LCSW ?04/03/2022 ? ?

## 2022-04-07 ENCOUNTER — Encounter: Payer: Self-pay | Admitting: Podiatry

## 2022-04-07 NOTE — Progress Notes (Signed)
?  Subjective:  ?Patient ID: Kylie Osborn, female    DOB: [birth year],  MRN: [id number] ? ?Chief Complaint  ?Patient presents with  ? Foot Pain  ?  Left foot pain- patient injured left foot while walking about 3-4 months ago. Patient complaining of sharp pain to top of foot. Pain does not radiate. Pt is able to flex foot but it is painful.   ? ? ?26 y.o. female presents with the above complaint. History confirmed with patient.  ? ?Objective:  ?Physical Exam: ?warm, good capillary refill, no trophic changes or ulcerative lesions, normal DP and PT pulses, normal sensory exam, and sharp pain on palpation and slightly hemosiderin on the second and third metatarsals distally ? ?Radiographs: ?Multiple views x-ray of the left foot: No obvious fracture no dislocation minimal degenerative changes ?Assessment:  ? ?1. Stress reaction of left foot, initial encounter   ?2. Tendonitis of ankle or foot   ? ? ? ?Plan:  ?Patient was evaluated and treated and all questions answered. ? ?Discussed with patient that she likely has a stress reaction or sprain of the left foot we reviewed her x-rays together.  I recommend support immobilization in a short cam boot and this was dispensed today.  I also recommended anti-inflammatories and methylprednisolone taper and Mobic was dispensed and sent to her pharmacy.  I will see her back in 1 month.  I would recommend an MRI of the left foot if not improving by that point. ? ?No follow-ups on file.  ? ?

## 2022-04-08 ENCOUNTER — Other Ambulatory Visit: Payer: Self-pay | Admitting: Family Medicine

## 2022-04-10 ENCOUNTER — Telehealth: Payer: Self-pay | Admitting: Family Medicine

## 2022-04-10 MED ORDER — VENLAFAXINE HCL ER 150 MG PO CP24: 150.0000 mg | ORAL_CAPSULE | Freq: Every day | ORAL | 2 refills | Status: DC

## 2022-04-10 MED ORDER — BACLOFEN 10 MG PO TABS: 5.0000 mg | ORAL_TABLET | Freq: Three times a day (TID) | ORAL | 1 refills | Status: DC

## 2022-04-10 NOTE — Telephone Encounter (Signed)
Pt called in asking for a refill on the venlafaxine and baclofen. Pt uses [facility] [address]  ?

## 2022-04-10 NOTE — Telephone Encounter (Signed)
Prescriptions sent

## 2022-04-15 ENCOUNTER — Ambulatory Visit (INDEPENDENT_AMBULATORY_CARE_PROVIDER_SITE_OTHER): Payer: 59 | Admitting: Family Medicine

## 2022-04-15 ENCOUNTER — Encounter: Payer: Self-pay | Admitting: Family Medicine

## 2022-04-15 VITALS — BP 110/68 | HR 80 | Temp 97.4°F | Resp 16 | Ht 69.25 in | Wt 145.8 lb

## 2022-04-15 DIAGNOSIS — M25532 Pain in left wrist: Secondary | ICD-10-CM | POA: Diagnosis not present

## 2022-04-15 DIAGNOSIS — F3181 Bipolar II disorder: Secondary | ICD-10-CM

## 2022-04-15 NOTE — Assessment & Plan Note (Signed)
Pt is currently well controlled on Venlafaxine XR 150mg  daily and Quetiapine 50mg  BID.  She is not feeling depressed and is better able to recognize and regulate her anger.  Is doing well at work as a .  Would like a job closer to home so she continues to .
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
## 2022-04-15 NOTE — Patient Instructions (Signed)
Follow up in 6 months to recheck mood- sooner if needed If the wrist pain doesn't get better w/ rest and the brace, please let me know and we'll refer you to a hand specialist Keep up the good work!  You look great! Call with any questions or concerns Have a great summer!

## 2022-04-24 ENCOUNTER — Ambulatory Visit (INDEPENDENT_AMBULATORY_CARE_PROVIDER_SITE_OTHER): Payer: Medicare Other | Admitting: Licensed Clinical Social Worker

## 2022-04-24 ENCOUNTER — Encounter (HOSPITAL_COMMUNITY): Payer: Self-pay

## 2022-04-24 DIAGNOSIS — F411 Generalized anxiety disorder: Secondary | ICD-10-CM
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
## 2022-05-13 ENCOUNTER — Ambulatory Visit (INDEPENDENT_AMBULATORY_CARE_PROVIDER_SITE_OTHER): Payer: Medicare Other | Admitting: Podiatry

## 2022-05-13 DIAGNOSIS — M84375A Stress fracture, left foot, initial encounter for fracture: Secondary | ICD-10-CM | POA: Diagnosis not present

## 2022-05-13 NOTE — Progress Notes (Signed)
  Subjective:  Patient ID: Kylie Osborn, female    DOB: 1996/06/21,  MRN: [id number]  Chief Complaint  Patient presents with   Foot Pain    4 week follow up right    26 y.o. female presents with the above complaint. History confirmed with patient.  Since last visit she has been wearing the boot some she says she has not worn it every day because it fell like it was throwing her hips and knees off  Objective:  Physical Exam: warm, good capillary refill, no trophic changes or ulcerative lesions, normal DP and PT pulses, normal sensory exam, and sharp pain on palpation and no much tenderness to palpation directly today Radiographs: Multiple views x-ray of the left foot: No obvious fracture no dislocation minimal degenerative changes Assessment:   1. Stress reaction of left foot, initial encounter      Plan:  Patient was evaluated and treated and all questions answered.  She has had some improvement.  I recommend she continue using the boot and also recommend an even up shoe which may help her ambulate more comfortably in this.  If its not better in 1 month at that point I would recommend MRI.  She will return to see me as needed.  Return if symptoms worsen or fail to improve.

## 2022-05-13 NOTE — Patient Instructions (Signed)
Look for an "EvenUp" shoe attachment on [employer] or at [employer]. This will level out your hips while you are walking in the CAM boot. Wear this on the other foot around a supportive sneaker:     

## 2022-06-19 ENCOUNTER — Telehealth: Payer: Self-pay | Admitting: Family Medicine

## 2022-06-19 ENCOUNTER — Telehealth (INDEPENDENT_AMBULATORY_CARE_PROVIDER_SITE_OTHER): Payer: Medicare Other | Admitting: Psychiatry

## 2022-06-19 ENCOUNTER — Encounter (HOSPITAL_COMMUNITY): Payer: Self-pay | Admitting: Psychiatry

## 2022-06-19 DIAGNOSIS — F0630 Mood disorder due to known physiological condition, unspecified: Secondary | ICD-10-CM

## 2022-06-19 DIAGNOSIS — F411 Generalized anxiety disorder: Secondary | ICD-10-CM | POA: Diagnosis not present

## 2022-06-19 DIAGNOSIS — F331 Major depressive disorder, recurrent, moderate: Secondary | ICD-10-CM

## 2022-06-19 NOTE — Telephone Encounter (Signed)
Caller name: Seraphim (pt)  On DPR? :yes/no: Yes  Call back number: [phone]  Provider they see: Dr. Beverely Low   Reason for call: Pt calling to get immunizations records for work.  Fax to: [phone] Pt states that she also needs proof of Varicella vaccination.

## 2022-06-19 NOTE — Telephone Encounter (Signed)
Faxed immunization report to [phone].
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
## 2022-07-08 ENCOUNTER — Encounter: Payer: Self-pay | Admitting: Podiatry

## 2022-08-11 ENCOUNTER — Other Ambulatory Visit: Payer: Self-pay | Admitting: Podiatry

## 2022-08-19 ENCOUNTER — Encounter: Payer: 59 | Admitting: Family Medicine

## 2022-08-25 ENCOUNTER — Other Ambulatory Visit: Payer: Self-pay | Admitting: Family Medicine

## 2022-09-09 ENCOUNTER — Ambulatory Visit (INDEPENDENT_AMBULATORY_CARE_PROVIDER_SITE_OTHER): Payer: 59 | Admitting: Family Medicine

## 2022-09-09 ENCOUNTER — Encounter: Payer: Self-pay | Admitting: Family Medicine

## 2022-09-09 VITALS — BP 122/80 | HR 89 | Temp 99.0°F | Resp 16 | Ht 69.25 in | Wt 151.2 lb

## 2022-09-09 DIAGNOSIS — Z23 Encounter for immunization: Secondary | ICD-10-CM | POA: Diagnosis not present

## 2022-09-09 DIAGNOSIS — Z0000 Encounter for general adult medical examination without abnormal findings: Secondary | ICD-10-CM

## 2022-09-09 NOTE — Progress Notes (Signed)
   Subjective:    Patient ID: KATISHA SHIMIZU, female    DOB: February 18, 1996, 26 y.o.   MRN: [id number]  HPI CPE- due for pap, UTD on Tdap.  Due for flu.  Not currently in a relationship.  Occasional alcohol, stopped vaping but started smoking 'blacks', occasional marijuana.  Health Maintenance  Topic Date Due   PAP-Cervical Cytology Screening  07/05/2022   PAP SMEAR-Modifier  07/05/2022   INFLUENZA VACCINE  02/23/2023 (Originally 06/25/2022)   TETANUS/TDAP  04/07/2027   HPV VACCINES  Completed   HIV Screening  Completed   COVID-19 Vaccine  Discontinued   Hepatitis C Screening  Discontinued      Review of Systems Patient reports no vision/ hearing changes, adenopathy,fever, weight change,  persistant/recurrent hoarseness , swallowing issues, chest pain, palpitations, edema, persistant/recurrent cough, hemoptysis, dyspnea (rest/exertional/paroxysmal nocturnal), gastrointestinal bleeding (melena, rectal bleeding), abdominal pain, significant heartburn, bowel changes, GU symptoms (dysuria, hematuria, incontinence), Gyn symptoms (abnormal  bleeding, pain),  syncope, focal weakness, memory loss, numbness & tingling, skin/hair/nail changes, abnormal bruising or bleeding, anxiety, or depression.     Objective:   Physical Exam General Appearance:    Alert, cooperative, no distress, appears stated age  Head:    Normocephalic, without obvious abnormality, atraumatic  Eyes:    PERRL, conjunctiva/corneas clear, EOM's intact both eyes  Ears:    Normal TM's and external ear canals, both ears  Nose:   Nares normal, septum midline, mucosa normal, no drainage    or sinus tenderness  Throat:   Lips, mucosa, and tongue normal; teeth and gums normal  Neck:   Supple, symmetrical, trachea midline, no adenopathy;    Thyroid: no enlargement/tenderness/nodules  Back:     Symmetric, no curvature, ROM normal, no CVA tenderness  Lungs:     Clear to auscultation bilaterally, respirations unlabored  Chest Wall:    No  tenderness or deformity   Heart:    Regular rate and rhythm, S1 and S2 normal, no murmur, rub   or gallop  Breast Exam:    Deferred to GYN  Abdomen:     Soft, non-tender, bowel sounds active all four quadrants,    no masses, no organomegaly  Genitalia:    Deferred to GYN  Rectal:    Extremities:   Extremities normal, atraumatic, no cyanosis or edema  Pulses:   2+ and symmetric all extremities  Skin:   Skin color, texture, turgor normal, no rashes or lesions  Lymph nodes:   Cervical, supraclavicular, and axillary nodes normal  Neurologic:   CNII-XII intact, normal strength, sensation and reflexes    throughout          Assessment & Plan:

## 2022-09-09 NOTE — Patient Instructions (Addendum)
Follow up in 1 year or as needed No need for labs today- they looked great last year!!! Try and quit smoking!!!  You don't need that stuff in your lungs!!! Call with any questions or concerns Stay safe!  Stay Healthy! Happy Fall!!!

## 2022-09-09 NOTE — Assessment & Plan Note (Signed)
Pt's PE unchanged from previous and WNL.  UTD on Tdap.  Flu shot given today.  Pt to call and schedule pap.  No need for labs due to lack of risk factors.  Anticipatory guidance provided.

## 2022-09-25 ENCOUNTER — Telehealth (HOSPITAL_COMMUNITY): Payer: 59 | Admitting: Psychiatry

## 2022-09-25 ENCOUNTER — Encounter (HOSPITAL_COMMUNITY): Payer: Self-pay | Admitting: Psychiatry

## 2022-09-25 DIAGNOSIS — F411 Generalized anxiety disorder: Secondary | ICD-10-CM

## 2022-09-25 DIAGNOSIS — F331 Major depressive disorder, recurrent, moderate: Secondary | ICD-10-CM | POA: Diagnosis not present

## 2022-09-25 DIAGNOSIS — F0630 Mood disorder due to known physiological condition, unspecified: Secondary | ICD-10-CM
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
## 2022-10-11 ENCOUNTER — Encounter: Payer: Self-pay | Admitting: Family Medicine

## 2022-10-11 ENCOUNTER — Ambulatory Visit (INDEPENDENT_AMBULATORY_CARE_PROVIDER_SITE_OTHER): Payer: 59 | Admitting: Family Medicine

## 2022-10-11 ENCOUNTER — Other Ambulatory Visit (HOSPITAL_COMMUNITY)
Admission: RE | Admit: 2022-10-11 | Discharge: 2022-10-11 | Disposition: A | Discharge status: Home / Self Care | Payer: 59 | Source: Ambulatory Visit | Attending: Family Medicine | Admitting: Family Medicine

## 2022-10-11 VITALS — BP 116/68 | HR 76 | Temp 98.0°F | Resp 17 | Ht 69.5 in | Wt 149.1 lb

## 2022-10-11 DIAGNOSIS — Z202 Contact with and (suspected) exposure to infections with a predominantly sexual mode of transmission: Secondary | ICD-10-CM | POA: Diagnosis present

## 2022-10-11 DIAGNOSIS — Z114 Encounter for screening for human immunodeficiency virus [HIV]: Secondary | ICD-10-CM

## 2022-10-11 NOTE — Progress Notes (Signed)
   Subjective:    Patient ID: Kylie Osborn, female    DOB: 02-Jun-1996, 26 y.o.   MRN: [id number]  HPI STD screening- 'i just want a 3 months check up'.  Has been sexually active.  No pain, burning, d/c, itching.   Review of Systems For ROS see HPI     Objective:   Physical Exam Vitals reviewed.  Constitutional:      General: She is not in acute distress.    Appearance: Normal appearance. She is not ill-appearing.  HENT:     Head: Normocephalic and atraumatic.  Skin:    General: Skin is warm and dry.  Neurological:     Mental Status: She is alert and oriented to person, place, and time. Mental status is at baseline.  Psychiatric:        Mood and Affect: Mood normal.        Behavior: Behavior normal.        Thought Content: Thought content normal.           Assessment & Plan:  Possible exposure to STDs/HIV screening- pt doesn't have specific symptoms or concerns but would like to be tested since having new partner.  Labs ordered.  Pt counseled on use of condoms for every sexual encounter.

## 2022-10-11 NOTE — Patient Instructions (Signed)
Follow up as needed or as scheduled We'll notify you of your lab results and make any changes if needed Make sure you are protecting yourself!!!  Condoms every time! Call with any questions or concerns Happy Holidays!!!

## 2022-10-12 LAB — RPR: RPR Ser Ql: NONREACTIVE

## 2022-10-12 LAB — HIV ANTIBODY (ROUTINE TESTING W REFLEX): HIV 1&2 Ab, 4th Generation: NONREACTIVE

## 2022-10-14 ENCOUNTER — Telehealth: Payer: Self-pay

## 2022-10-14 NOTE — Telephone Encounter (Signed)
-----   Message from Sheliah Hatch, MD sent at 10/14/2022  7:46 AM EST ----- HIV and syphilis are both negative- great news!  Waiting on other results

## 2022-10-14 NOTE — Telephone Encounter (Signed)
Informed pt of lab results  

## 2022-10-16 ENCOUNTER — Other Ambulatory Visit: Payer: Self-pay

## 2022-10-16 ENCOUNTER — Ambulatory Visit (INDEPENDENT_AMBULATORY_CARE_PROVIDER_SITE_OTHER): Payer: 59 | Admitting: *Deleted

## 2022-10-16 ENCOUNTER — Telehealth: Payer: Self-pay

## 2022-10-16 DIAGNOSIS — Z0000 Encounter for general adult medical examination without abnormal findings: Secondary | ICD-10-CM | POA: Diagnosis not present

## 2022-10-16 LAB — URINE CYTOLOGY ANCILLARY ONLY
Bacterial Vaginitis-Urine: POSITIVE — AB
Candida Urine: NEGATIVE — AB
Candida Urine: POSITIVE — AB
Chlamydia: NEGATIVE
Comment: NEGATIVE
Comment: NEGATIVE
Comment: NORMAL
Neisseria Gonorrhea: NEGATIVE
Trichomonas: NEGATIVE

## 2022-10-16 MED ORDER — METRONIDAZOLE 500 MG PO TABS: 500.0000 mg | ORAL_TABLET | Freq: Two times a day (BID) | ORAL | 0 refills | Status: AC

## 2022-10-16 MED ORDER — FLUCONAZOLE 150 MG PO TABS: 150.0000 mg | ORAL_TABLET | Freq: Once | ORAL | 0 refills | Status: AC

## 2022-10-16 NOTE — Telephone Encounter (Signed)
Informed pt of lab results . Sent in Diflucan 150 mg and Flagyl 500 mg BID # 14 to the pharmacy. Advised pt to take Flagyl with food

## 2022-10-16 NOTE — Patient Instructions (Signed)
Kylie Osborn , Thank you for taking time to come for your Medicare Wellness Visit. I appreciate your ongoing commitment to your health goals. Please review the following plan we discussed and let me know if I can assist you in the future.   These are the goals we discussed:  Goals      Patient Stated     Would like to gain 10 lbs      Weight (lb) < 200 lb (90.7 kg)     Would like to gain 10-15 pounds        This is a list of the screening recommended for you and due dates:  Health Maintenance  Topic Date Due   Pap Smear  07/05/2022   Pap Smear  07/05/2022   Medicare Annual Wellness Visit  10/17/2023   Flu Shot  Completed   HPV Vaccine  Completed   HIV Screening  Completed   COVID-19 Vaccine  Discontinued   Hepatitis C Screening: USPSTF Recommendation to screen - Ages 18-79 yo.  Discontinued    Advanced directives: Education provided  Conditions/risks identified:    Preventive Care 18-30 Years Old, Female Preventive care refers to lifestyle choices and visits with your health care provider that can promote health and wellness. Preventive care visits are also called wellness exams. What can I expect for my preventive care visit? Counseling During your preventive care visit, your health care provider may ask about your: Medical history, including: Past medical problems. Family medical history. Pregnancy history. Current health, including: Menstrual cycle. Method of birth control. Emotional well-being. Home life and relationship well-being. Sexual activity and sexual health. Lifestyle, including: Alcohol, nicotine or tobacco, and drug use. Access to firearms. Diet, exercise, and sleep habits. Work and work Statistician. Sunscreen use. Safety issues such as seatbelt and bike helmet use. Physical exam Your health care provider may check your: Height and weight. These may be used to calculate your BMI (body mass index). BMI is a measurement that tells if you are at a  healthy weight. Waist circumference. This measures the distance around your waistline. This measurement also tells if you are at a healthy weight and may help predict your risk of certain diseases, such as type 2 diabetes and high blood pressure. Heart rate and blood pressure. Body temperature. Skin for abnormal spots. What immunizations do I need? Vaccines are usually given at various ages, according to a schedule. Your health care provider will recommend vaccines for you based on your age, medical history, and lifestyle or other factors, such as travel or where you work. What tests do I need? Screening Your health care provider may recommend screening tests for certain conditions. This may include: Pelvic exam and Pap test. Lipid and cholesterol levels. Diabetes screening. This is done by checking your blood sugar (glucose) after you have not eaten for a while (fasting). Hepatitis B test. Hepatitis C test. HIV (human immunodeficiency virus) test. STI (sexually transmitted infection) testing, if you are at risk. BRCA-related cancer screening. This may be done if you have a family history of breast, ovarian, tubal, or peritoneal cancers. Talk with your health care provider about your test results, treatment options, and if necessary, the need for more tests. Follow these instructions at home: Eating and drinking  Eat a healthy diet that includes fresh fruits and vegetables, whole grains, lean protein, and low-fat dairy products. Take vitamin and mineral supplements as recommended by your health care provider. Do not drink alcohol if: Your health care provider tells you  not to drink. You are pregnant, may be pregnant, or are planning to become pregnant. If you drink alcohol: Limit how much you have to 0-1 drink a day. Know how much alcohol is in your drink. In the [place] one drink equals one 12 oz bottle of beer (355 mL), one 5 oz glass of wine (148 mL), or one 1 oz glass of hard liquor  (44 mL). Lifestyle Brush your teeth every morning and night with fluoride toothpaste. Floss one time each day. Exercise for at least 30 minutes 5 or more days each week. Do not use any products that contain nicotine or tobacco. These products include cigarettes, chewing tobacco, and vaping devices, such as e-cigarettes. If you need help quitting, ask your health care provider. Do not use drugs. If you are sexually active, practice safe sex. Use a condom or other form of protection to prevent STIs. If you do not wish to become pregnant, use a form of birth control. If you plan to become pregnant, see your health care provider for a prepregnancy visit. Find healthy ways to manage stress, such as: Meditation, yoga, or listening to music. Journaling. Talking to a trusted person. Spending time with friends and family. Minimize exposure to UV radiation to reduce your risk of skin cancer. Safety Always wear your seat belt while driving or riding in a vehicle. Do not drive: If you have been drinking alcohol. Do not ride with someone who has been drinking. If you have been using any mind-altering substances or drugs. While texting. When you are tired or distracted. Wear a helmet and other protective equipment during sports activities. If you have firearms in your house, make sure you follow all gun safety procedures. Seek help if you have been physically or sexually abused. What's next? Go to your health care provider once a year for an annual wellness visit. Ask your health care provider how often you should have your eyes and teeth checked. Stay up to date on all vaccines. This information is not intended to replace advice given to you by your health care provider. Make sure you discuss any questions you have with your health care provider. Document Revised: 05/09/2021 Document Reviewed: 05/09/2021 Elsevier Patient Education  [place].

## 2022-10-16 NOTE — Telephone Encounter (Signed)
-----   Message from Sheliah Hatch, MD sent at 10/16/2022 11:24 AM EST ----- Thankfully no evidence of gonorrhea or chlamydia but you do have yeast and BV.  To treat this, we will send Diflucan 150mg  x1 dose (#1, 0 refills) for the yeast and Flagyl 500mg  twice daily x7 days (#14, 0 refills)

## 2022-10-16 NOTE — Progress Notes (Signed)
Subjective:   Kylie Osborn is a 25 y.o. female who presents for Medicare Annual (Subsequent) preventive examination.  I connected with  Kylie Osborn on 10/16/22 by a telephone enabled telemedicine application and verified that I am speaking with the correct person using two identifiers.   I discussed the limitations of evaluation and management by telemedicine. The patient expressed understanding and agreed to proceed.  Patient location: home  Provider location: Tele-health-office    Review of Systems     Cardiac Risk Factors include: advanced age (>31mn, >>3women);family history of premature cardiovascular disease     Objective:    Today's Vitals   There is no height or weight on file to calculate BMI.     10/16/2022   12:50 PM 08/27/2021   10:26 AM 01/08/2019   10:51 AM 01/20/2018    8:55 AM 01/01/2018   11:05 AM 12/25/2017   11:08 AM 12/11/2017   11:06 AM  Advanced Directives  Does Patient Have a Medical Advance Directive? No No _0   Type of AVisual merchandiserof AFreescale SemiconductorPower of AFreescale SemiconductorPower of Attorney  Does patient want to make changes to medical advance directive?   No - Patient declined      Copy of [place]?    No - copy requested No - copy requested No - copy requested No - copy requested  Would patient like information on creating a medical advance directive? No - Patient declined No - Patient declined         Current Medications (verified) Outpatient Encounter Medications as of 10/16/2022  Medication Sig   aspirin 325 MG tablet Take 325 mg by mouth daily.   baclofen (LIORESAL) 10 MG tablet TAKE 0.5 TABLETS (5 MG TOTAL) BY MOUTH 3 (THREE) TIMES DAILY.   meloxicam (MOBIC) 15 MG tablet TAKE 1 TABLET BY MOUTH EVERY DAY AS NEEDED FOR PAIN   QUEtiapine (SEROQUEL) 50 MG tablet TAKE 1 TABLET BY MOUTH TWICE A DAY   venlafaxine XR (EFFEXOR-XR) 150 MG  24 hr capsule Take 1 capsule (150 mg total) by mouth daily with breakfast.   Facility-Administered Encounter Medications as of 10/16/2022  Medication   gadopentetate dimeglumine (MAGNEVIST) injection 20 mL    Allergies (verified) Patient has no known allergies.   History: Past Medical History:  Diagnosis Date   Strep pharyngitis 10/22/2015   [place]   Past Surgical History:  Procedure Laterality Date   ESOPHAGOGASTRODUODENOSCOPY N/A 04/16/2017   Procedure: ESOPHAGOGASTRODUODENOSCOPY (EGD);  Surgeon: WJudeth Horn MD;  [facility]  Service: General;  Laterality: N/A;   LACERATION REPAIR N/A 04/08/2017   Procedure: SCALP LACERATION REPAIR;  Surgeon: DMichael Litter DMD;  [place] [facility]  Service: Oral Surgery;  Laterality: N/A;   ORIF ULNAR FRACTURE Left 04/08/2017   Procedure: OPEN REDUCTION INTERNAL FIXATION (ORIF) ULNAR FRACTURE and RADIAL FRACTURE;  Surgeon: MRenette Butters MD;  [place] [facility]  Service: Orthopedics;  Laterality: Left;   PEG PLACEMENT N/A 04/16/2017   Procedure: PERCUTANEOUS ENDOSCOPIC GASTROSTOMY (PEG) PLACEMENT;  Surgeon: WJudeth Horn MD;  [facility] [facility]  Service: General;  Laterality: N/A;   PERCUTANEOUS TRACHEOSTOMY N/A 04/16/2017   Procedure: PERCUTANEOUS TRACHEOSTOMY;  Surgeon: WJudeth Horn MD;  [facility]  Service: General;  Laterality: N/A;   POSTERIOR CERVICAL FUSION/FORAMINOTOMY N/A 04/06/2017   Procedure: C2- C4 LAMINECTOMY FOR DECOMPRESSION, OPEN REDUCTION  OF DISPLACED FRACTURE , C2- C5 POSTERIOR FIXATION FUSION;  Surgeon: [name], Kevan Ny, MD;  Location: [facility];  Service: Neurosurgery;  Laterality: N/A;   Family History  Problem Relation Age of Onset   Hypertension Mother    Liver disease Father    Diabetes Maternal Grandmother    Hypertension Maternal Grandfather    Social History   Socioeconomic History   Marital status: Single    Spouse name: Not on file   Number of  children: 0   Years of education: college student   Highest education level: Not on file  Occupational History   Not on file  Tobacco Use   Smoking status: Never   Smokeless tobacco: Never  Vaping Use   Vaping Use: Never used  Substance and Sexual Activity   Alcohol use: Yes    Alcohol/week: 1.0 standard drink of alcohol    Types: 1 Shots of liquor per week    Comment: occasional use, approximately 1 time a month   Drug use: Yes    Types: Marijuana    Comment: Occasional use, approximately once every 3 months.   Sexual activity: Yes    Partners: Male    Birth control/protection: None  Other Topics Concern   Not on file  Social History Narrative   ** Merged History Encounter **       Lives at home with parents. 1 cup caffeine per day. Ambidextrous.    Social Determinants of Health   Financial Resource Strain: Low Risk  (10/16/2022)   Overall Financial Resource Strain (CARDIA)    Difficulty of Paying Living Expenses: Not hard at all  Food Insecurity: No Food Insecurity (10/16/2022)   Hunger Vital Sign    Worried About Running Out of Food in the Last Year: Never true    Ran Out of Food in the Last Year: Never true  Transportation Needs: No Transportation Needs (08/27/2021)   PRAPARE - Hydrologist (Medical): No    Lack of Transportation (Non-Medical): No  Physical Activity: Insufficiently Active (08/27/2021)   Exercise Vital Sign    Days of Exercise per Week: 3 days    Minutes of Exercise per Session: 40 min  Stress: Stress Concern Present (08/27/2021)   [place]    Feeling of Stress : Rather much  Social Connections: Unknown (10/16/2022)   Social Connection and Isolation Panel [NHANES]    Frequency of Communication with Friends and Family: Twice a week    Frequency of Social Gatherings with Friends and Family: Three times a week    Attends Religious Services: Never     Active Member of Clubs or Organizations: No    Attends Music therapist: Never    Marital Status: Not on file    Tobacco Counseling Counseling given: Not Answered   Clinical Intake:  Pre-visit preparation completed: Yes  Pain : No/denies pain     Diabetes: No  How often do you need to have someone help you when you read instructions, pamphlets, or other written materials from your doctor or pharmacy?: 1 - Never  Diabetic?  no  Interpreter Needed?: No  Information entered by :: Leroy Kennedy LPN   Activities of Daily Living    10/16/2022   12:51 PM 09/09/2022    2:41 PM  In your present state of health, do you have any difficulty performing the following activities:  Hearing? 0 0  Vision? 0 0  Difficulty  concentrating or making decisions? 0 0  Walking or climbing stairs? 0 0  Dressing or bathing? 0 0  Doing errands, shopping? 0 0  Preparing Food and eating ? N   Using the Toilet? N   In the past six months, have you accidently leaked urine? Y   Do you have problems with loss of bowel control? N   Managing your Medications? N   Managing your Finances? N   Housekeeping or managing your Housekeeping? N     Patient Care Team: Midge Minium, MD as PCP - General  Indicate any recent Medical Services you may have received from other than [facility] providers in the past year (date may be approximate).     Assessment:   This is a routine wellness examination for Kylie Osborn.  Hearing/Vision screen Hearing Screening - Comments:: No trouble hearing Vision Screening - Comments:: Not up to date  Dietary issues and exercise activities discussed: Current Exercise Habits: Home exercise routine, Type of exercise: stretching;strength training/weights, Time (Minutes): 20, Frequency (Times/Week): 4, Weekly Exercise (Minutes/Week): 80, Intensity: Mild   Goals Addressed             This Visit's Progress    Weight (lb) < 200 lb (90.7 kg)       Would like to gain  10-15 pounds       Depression Screen    10/16/2022   12:54 PM 10/11/2022   10:53 AM 09/09/2022    2:41 PM 03/18/2022    9:20 AM 12/19/2021    3:17 PM 11/27/2021    9:06 AM 10/22/2021    9:14 AM  PHQ 2/9 Scores  PHQ - 2 Score 1 0 0 0   0  PHQ- 9 Score 4 0 1 0   0     Information is confidential and restricted. Go to Review Flowsheets to unlock data.    Fall Risk    10/16/2022   12:48 PM 10/11/2022   10:53 AM 09/09/2022    2:41 PM 03/18/2022    9:20 AM 10/22/2021    9:15 AM  Fall Risk   Falls in the past year? 1 0 1 0 0  Number falls in past yr: 0  0    Injury with Fall? 0  0    Risk for fall due to :  No Fall Risks History of fall(s) No Fall Risks No Fall Risks  Follow up Falls evaluation completed;Education provided;Falls prevention discussed Falls evaluation completed Falls evaluation completed Falls evaluation completed Falls evaluation completed    FALL RISK PREVENTION PERTAINING TO THE HOME:  Any stairs in or around the home? Yes  If so, are there any without handrails? No  Home free of loose throw rugs in walkways, pet beds, electrical cords, etc? Yes  Adequate lighting in your home to reduce risk of falls? Yes   ASSISTIVE DEVICES UTILIZED TO PREVENT FALLS:  Life alert? No  Use of a cane, walker or w/c? No  Grab bars in the bathroom? Yes  Shower chair or bench in shower? No  Elevated toilet seat or a handicapped toilet? No   TIMED UP AND GO:  Was the test performed? No .    Cognitive Function:        10/16/2022   12:49 PM  6CIT Screen  What Year? 0 points  What month? 0 points  What time? 0 points  Count back from 20 0 points  Months in reverse 0 points  Repeat phrase 0 points  Total  Score 0 points    Immunizations Immunization History  Administered Date(s) Administered   DTP 04/26/1997, 06/14/1997, 08/16/1997, 02/22/1998, 08/31/2002   H1N1 11/16/2008   HIB (PRP-OMP) 04/26/1997, 06/14/1997, 02/22/1998   HPV Quadrivalent 05/22/2011    Hepatitis B Aug 28, 1996, 03/07/1997, 08/16/1997   Hpv-Unspecified 11/16/2010, 01/16/2011   Influenza,inj,Quad PF,6+ Mos 10/27/2014, 09/11/2017, 11/20/2018, 09/22/2019, 08/23/2021, 09/09/2022   MMR 02/22/1998, 08/31/2002   Meningococcal Conjugate 06/17/2013   OPV 04/26/1997, 06/14/1997, 02/22/1998, 08/31/2002   PFIZER(Purple Top)SARS-COV-2 Vaccination 03/25/2020, 04/17/2020, 10/30/2020   PPD Test 09/07/2016, 05/18/2019, 09/04/2021   Td 07/15/2008   Tdap 04/06/2017    TDAP status: Up to date  Flu Vaccine status: Up to date    Covid-19 vaccine status: Information provided on how to obtain vaccines.   Qualifies for Shingles Vaccine? No        Screening Tests Health Maintenance  Topic Date Due   PAP-Cervical Cytology Screening  07/05/2022   PAP SMEAR-Modifier  07/05/2022   Medicare Annual Wellness (AWV)  10/17/2023   INFLUENZA VACCINE  Completed   HPV VACCINES  Completed   HIV Screening  Completed   COVID-19 Vaccine  Discontinued   Hepatitis C Screening  Discontinued    Health Maintenance  Health Maintenance Due  Topic Date Due   PAP-Cervical Cytology Screening  07/05/2022   PAP SMEAR-Modifier  07/05/2022        Lung Cancer Screening: (Low Dose CT Chest recommended if Age 60-80 years, 30 pack-year currently smoking OR have quit w/in 15years.) does not qualify.   Lung Cancer Screening Referral:   Additional Screening:  Hepatitis C Screening: does not qualify;   Vision Screening: Recommended annual ophthalmology exams for early detection of glaucoma and other disorders of the eye. Is the patient up to date with their annual eye exam?  No  Who is the provider or what is the name of the office in which the patient attends annual eye exams?  If pt is not established with a provider, would they like to be referred to a provider to establish care? No .   Dental Screening: Recommended annual dental exams for proper oral hygiene  Community Resource Referral / Chronic  Care Management: CRR required this visit?  No   CCM required this visit?  No      Plan:     I have personally reviewed and noted the following in the patient's chart:   Medical and social history Use of alcohol, tobacco or illicit drugs  Current medications and supplements including opioid prescriptions. Patient is not currently taking opioid prescriptions. Functional ability and status Nutritional status Physical activity Advanced directives List of other physicians Hospitalizations, surgeries, and ER visits in previous 12 months Vitals Screenings to include cognitive, depression, and falls Referrals and appointments  In addition, I have reviewed and discussed with patient certain preventive protocols, quality metrics, and best practice recommendations. A written personalized care plan for preventive services as well as general preventive health recommendations were provided to patient.     Leroy Kennedy, LPN   [id number]   Nurse Notes:

## 2022-11-11 ENCOUNTER — Other Ambulatory Visit: Payer: Self-pay | Admitting: Family Medicine

## 2022-12-13 LAB — HM PAP SMEAR: HPV, high-risk: NEGATIVE

## 2022-12-24 ENCOUNTER — Ambulatory Visit (INDEPENDENT_AMBULATORY_CARE_PROVIDER_SITE_OTHER): Payer: 59 | Admitting: Family Medicine

## 2022-12-24 ENCOUNTER — Encounter: Payer: Self-pay | Admitting: Family Medicine

## 2022-12-24 VITALS — BP 112/70 | HR 73 | Temp 97.3°F | Resp 18 | Ht 69.5 in | Wt 154.0 lb

## 2022-12-24 DIAGNOSIS — R221 Localized swelling, mass and lump, neck: Secondary | ICD-10-CM | POA: Diagnosis not present

## 2022-12-24 MED ORDER — DOXYCYCLINE HYCLATE 100 MG PO TABS: 100.0000 mg | ORAL_TABLET | Freq: Two times a day (BID) | ORAL | 0 refills | Status: AC

## 2022-12-24 NOTE — Patient Instructions (Signed)
Follow up via MyChart on Monday and let me know how things are going START the Doxycycline twice daily- take w/ food HOT compresses to the neck but be careful- don't burn yourself If the area comes to a head and starts draining, that's ok Try not to pick or squeeze Call with any questions or concerns Hang in there!!!

## 2022-12-24 NOTE — Progress Notes (Signed)
   Subjective:    Patient ID: Kylie Osborn, female    DOB: Mar 29, 1996, 27 y.o.   MRN: [id number]  HPI Lump on neck- 'there's something on my throat and it hurts'.  Noticed ~1 week ago.  Area is uncomfortable all the time but worse when touched.  No pain w/ swallowing.  No overlying redness.  'i plucked a hair'   Review of Systems For ROS see HPI     Objective:   Physical Exam Vitals reviewed.  Constitutional:      General: She is not in acute distress.    Appearance: Normal appearance. She is not ill-appearing.  HENT:     Head: Normocephalic and atraumatic.  Neck:     Comments: Firm, freely mobile 1 cm cystic feeling mass directly under skin in the midline of upper anterior neck/throat.  + TTP.  No fluctuance or overlying redness. Musculoskeletal:     Cervical back: Normal range of motion and neck supple.  Lymphadenopathy:     Cervical: No cervical adenopathy.  Skin:    General: Skin is warm and dry.     Findings: No erythema.  Neurological:     Mental Status: She is alert and oriented to person, place, and time. Mental status is at baseline.           Assessment & Plan:   Neck mass- new.  Pt reports plucking a hair earlier this month.  Is not sure if this is the same location but ~1 week ago developed firm, mobile, painful area just under chin on anterior neck.  Area feels cystic.  Not consistent w/ LAD.  No area to drain at this time.  Start compresses and Doxycycline for possible infected ingrown hair.  If no improvement by Monday pt to let me know and we will proceed w/ [place] or ENT referral.  Pt expressed understanding and is in agreement w/ plan.

## 2022-12-30 ENCOUNTER — Telehealth (INDEPENDENT_AMBULATORY_CARE_PROVIDER_SITE_OTHER): Payer: 59 | Admitting: Family Medicine

## 2022-12-30 ENCOUNTER — Encounter: Payer: Self-pay | Admitting: Family Medicine

## 2022-12-30 DIAGNOSIS — R221 Localized swelling, mass and lump, neck: Secondary | ICD-10-CM
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
## 2023-01-11 ENCOUNTER — Ambulatory Visit (HOSPITAL_BASED_OUTPATIENT_CLINIC_OR_DEPARTMENT_OTHER)
Admission: RE | Admit: 2023-01-11 | Discharge: 2023-01-11 | Disposition: A | Discharge status: Home / Self Care | Payer: 59 | Source: Ambulatory Visit | Attending: Family Medicine | Admitting: Family Medicine

## 2023-01-11 DIAGNOSIS — R221 Localized swelling, mass and lump, neck: Secondary | ICD-10-CM | POA: Diagnosis present

## 2023-01-13 ENCOUNTER — Telehealth: Payer: Self-pay

## 2023-01-13 NOTE — Telephone Encounter (Signed)
Informed pt of [place] result

## 2023-01-13 NOTE — Telephone Encounter (Signed)
-----   Message from Midge Minium, MD sent at 01/13/2023  8:22 AM EST ----- Ultrasound shows benign (normal) appearing cyst.  This is great news!

## 2023-01-27 ENCOUNTER — Encounter (HOSPITAL_COMMUNITY): Payer: Self-pay | Admitting: Psychiatry

## 2023-01-27 ENCOUNTER — Telehealth (INDEPENDENT_AMBULATORY_CARE_PROVIDER_SITE_OTHER): Payer: 59 | Admitting: Psychiatry

## 2023-01-27 DIAGNOSIS — F331 Major depressive disorder, recurrent, moderate: Secondary | ICD-10-CM | POA: Diagnosis not present

## 2023-01-27 DIAGNOSIS — F411 Generalized anxiety disorder: Secondary | ICD-10-CM
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
## 2023-02-01 ENCOUNTER — Other Ambulatory Visit: Payer: Self-pay | Admitting: Family Medicine

## 2023-02-03 ENCOUNTER — Telehealth: Payer: Self-pay | Admitting: Family Medicine

## 2023-02-03 ENCOUNTER — Other Ambulatory Visit: Payer: Self-pay

## 2023-02-03 MED ORDER — BACLOFEN 10 MG PO TABS: 10.0000 mg | ORAL_TABLET | Freq: Two times a day (BID) | ORAL | 1 refills | Status: DC

## 2023-02-03 NOTE — Telephone Encounter (Signed)
Patient is aware of medication sent in.  [place]  P:[phone]

## 2023-02-03 NOTE — Telephone Encounter (Signed)
Prescription changed as requested and sent to pharmacy

## 2023-02-03 NOTE — Telephone Encounter (Signed)
Initial Comment Caller states that she needs a refill on her baclofen. She has been out since Thursday. She is having muscle spasms. Translation No Nurse Assessment Nurse: Yong Channel, RN, Katelyn Date/Time (Eastern Time): 02/01/2023 3:08:07 PM Confirm and document reason for call. If symptomatic, describe symptoms. ---Caller states that she needs a refill on her Baclofen '5mg'$  TID. She has been out since Thursday. She is having muscle spasms in her legs and hands. She reports that she has a spinal cord injury. Does the patient have any new or worsening symptoms? ---Yes Will a triage be completed? ---Yes Related visit to physician within the last 2 weeks? ---No Does the PT have any chronic conditions? (i.e. diabetes, asthma, this includes High risk factors for pregnancy, etc.) ---Yes List chronic conditions. ---Spinal cord injury Is the patient pregnant or possibly pregnant? (Ask all females between the ages of 66-55) ---No Is this a behavioral health or substance abuse call? ---No Nurse: Yong Channel, RN, Katelyn Date/Time (Eastern Time): 02/01/2023 3:11:31 PM Please select the assessment type ---Refill Additional Documentation ---[facility] [id number] PLEASE NOTE: All timestamps contained within this report are represented as [place] Standard Time. CONFIDENTIALTY NOTICE: This fax transmission is intended only for the addressee. It contains information that is legally privileged, confidential or otherwise protected from use or disclosure. If you are not the intended recipient, you are strictly prohibited from reviewing, disclosing, copying using or disseminating any of this information or taking any action in reliance on or regarding this information. If you have received this fax in error, please notify us immediately by telephone so that we can arrange for its return to [place]. Phone: [phone], Toll-Free: [phone], Fax: [phone] Page: 2 of 4 Call Id: [id number] Nurse Assessment Does  the patient have enough medication to last until the office opens? ---Unable to obtain loaner dose from Pharmacy Does the client directives allow for assistance with medications after hours? ---Yes Was the medication filled within the last 6 months? ---Yes What is the name of the medication, dose and instructions as listed on the bottle? ---Baclofen '5mg'$  TID Name of the physician as listed on the bottle. ---[place] name and phone number where most recently filled. ---[facility] [address] [id number] Guidelines Guideline Title Affirmed Question Affirmed Notes Nurse Date/Time Eilene Ghazi Time) Muscle Jerks - Tics - Shudders Tics occur frequently Yong Channel, RN, [place] 02/01/2023 3:09:33 PM Disp. Time Eilene Ghazi Time) Disposition Final User 02/01/2023 3:11:26 PM SEE PCP WITHIN 3 DAYS Yes Yong Channel, RN, [place] Final Disposition 02/01/2023 3:11:26 PM SEE PCP WITHIN 3 DAYS Yes Yong Channel, RN, [place] Caller Disagree/Comply Comply Caller Understands Yes PreDisposition Call Doctor Care Advice Given Per Guideline SEE PCP WITHIN 3 DAYS: * You need to be seen within 2 or 3 days. * PCP VISIT: Call your doctor (or NP/PA) during regular office hours and make an appointment. A clinic or urgent care center are good places to go for care if your doctor's office is closed or you can't get an appointment. NOTE: If office will be open tomorrow, tell caller to call then, not in 3 days. CALL BACK IF: * You develop other worrisome symptoms * You become worse CARE ADVICE given per Muscle Jerks - Tics - Shudders (Adult) guideline. Verbal Orders/Maintenance Medications Medication Refill Route Dosage Regime Duration Admin Instructions User Name Baclofen Yes Oral '5mg'$  3 Days Take three times daily Yong Channel, RN, [employer] Comments User: Hosie Spangle, RN Date/Time Eilene Ghazi Time): 02/01/2023 3:25:02 PM PLEASE NOTE: All timestamps contained within this report are represented as [place] Standard  Time. CONFIDENTIALTY  NOTICE: This fax transmission is intended only for the addressee. It contains information that is legally privileged, confidential or otherwise protected from use or disclosure. If you are not the intended recipient, you are strictly prohibited from reviewing, disclosing, copying using or disseminating any of this information or taking any action in reliance on or regarding this information. If you have received this fax in error, please notify us immediately by telephone so that we can arrange for its return to [place]. Phone: [phone], Toll-Free: [phone], Fax: [phone] Page: 3 of 4 Call Id: [id number] Comments Called the pharmacy and they notified me the patient is attempting to refill medication a month early. Speaking to charge RN now. User: Hosie Spangle, RN Date/Time Eilene Ghazi Time): 02/01/2023 3:30:24 PM Pt notified that the medication cannot be refilled at this time as it is a month early. Pt upset but agreeable. She was advised that she go to an urgent care if she is symptomatic as a next step for her. Pt is agreeable and stated she will do that. Referrals REFERRED TO PCP OFFICE    Verbal order signature required, placed form in front bin

## 2023-02-03 NOTE — Telephone Encounter (Signed)
Caller name: BRENNA STEERMAN  On DPR?: Yes  Call back number: [phone] (mobile)  Provider they see: Midge Minium, MD  Reason for call: Patient called to see if Dr.Tabori could up her medication intake for Baclofen 10 mg. Patient suppose take a half tab of baclofen 10 mg.Patient states that the medication works better if she takes one tab in the morning and one tab at night. Patient runs out of medication fast.

## 2023-02-03 NOTE — Telephone Encounter (Signed)
Pt has picked up her Rx for Baclofen she is asking if she can increase this she is currently taking 2 tablets daily

## 2023-03-12 ENCOUNTER — Ambulatory Visit (INDEPENDENT_AMBULATORY_CARE_PROVIDER_SITE_OTHER): Payer: Medicare Other | Admitting: Family Medicine

## 2023-03-12 ENCOUNTER — Other Ambulatory Visit (HOSPITAL_COMMUNITY)
Admission: RE | Admit: 2023-03-12 | Discharge: 2023-03-12 | Disposition: A | Discharge status: Home / Self Care | Payer: Medicare Other | Source: Ambulatory Visit | Attending: Family Medicine | Admitting: Family Medicine

## 2023-03-12 ENCOUNTER — Encounter: Payer: Self-pay | Admitting: Family Medicine

## 2023-03-12 VITALS — BP 102/72 | HR 77 | Temp 97.8°F | Resp 17 | Ht 69.5 in | Wt 155.5 lb

## 2023-03-12 DIAGNOSIS — Z202 Contact with and (suspected) exposure to infections with a predominantly sexual mode of transmission: Secondary | ICD-10-CM | POA: Insufficient documentation

## 2023-03-12 DIAGNOSIS — Z114 Encounter for screening for human immunodeficiency virus [HIV]: Secondary | ICD-10-CM | POA: Diagnosis not present

## 2023-03-12 NOTE — Patient Instructions (Signed)
Follow up as needed or as scheduled We'll notify you of your lab results and make any changes if needed Make sure you are protecting yourself!!! Call with any questions or concerns Stay Safe!  Stay Healthy!! Happy Spring!!!

## 2023-03-12 NOTE — Progress Notes (Signed)
   Subjective:    Patient ID: Kylie Osborn, female    DOB: 07-Feb-1996, 27 y.o.   MRN: [id number]  HPI STD testing- pt reports odor to urine.  No burning w/ urination.  Some vaginal d/c- + odor.  No vaginal lesions.  Would like to be tested to be cautious   Review of Systems For ROS see HPI     Objective:   Physical Exam Vitals reviewed.  Constitutional:      General: She is not in acute distress.    Appearance: Normal appearance. She is not ill-appearing.  HENT:     Head: Normocephalic and atraumatic.  Skin:    General: Skin is warm and dry.  Neurological:     Mental Status: She is alert and oriented to person, place, and time. Mental status is at baseline.  Psychiatric:        Mood and Affect: Mood normal.        Behavior: Behavior normal.        Thought Content: Thought content normal.           Assessment & Plan:  Possible exposure to STD- pt doesn't feel that there was a specific exposure but would like to be tested out of an abundance of caution.  Does note cloudy urine.  Encouraged her to use condoms at each encounter.  Will follow.

## 2023-03-13 ENCOUNTER — Telehealth: Payer: Self-pay

## 2023-03-13 LAB — URINE CYTOLOGY ANCILLARY ONLY
Bacterial Vaginitis-Urine: NEGATIVE
Candida Urine: NEGATIVE
Chlamydia: NEGATIVE
Comment: NEGATIVE
Comment: NEGATIVE
Comment: NORMAL
Neisseria Gonorrhea: NEGATIVE
Trichomonas: NEGATIVE

## 2023-03-13 LAB — RPR: RPR Ser Ql: NONREACTIVE

## 2023-03-13 LAB — HIV ANTIBODY (ROUTINE TESTING W REFLEX): HIV 1&2 Ab, 4th Generation: NONREACTIVE

## 2023-03-13 NOTE — Telephone Encounter (Signed)
Pt seen results Via my chart  

## 2023-03-13 NOTE — Telephone Encounter (Signed)
-----   Message from Sheliah Hatch, MD sent at 03/13/2023  3:48 PM EDT ----- No evidence of HIV.  Waiting on other results

## 2023-04-04 ENCOUNTER — Other Ambulatory Visit: Payer: Self-pay | Admitting: Family Medicine

## 2023-04-30 ENCOUNTER — Telehealth: Payer: Self-pay | Admitting: Family Medicine

## 2023-05-01 ENCOUNTER — Encounter: Payer: Self-pay | Admitting: Family Medicine

## 2023-05-01 ENCOUNTER — Ambulatory Visit (INDEPENDENT_AMBULATORY_CARE_PROVIDER_SITE_OTHER): Payer: Medicare Other | Admitting: Family Medicine

## 2023-05-01 VITALS — BP 114/72 | HR 78 | Temp 98.4°F | Resp 17 | Ht 69.5 in | Wt 154.5 lb

## 2023-05-01 DIAGNOSIS — L089 Local infection of the skin and subcutaneous tissue, unspecified: Secondary | ICD-10-CM

## 2023-05-01 DIAGNOSIS — S60851A Superficial foreign body of right wrist, initial encounter: Secondary | ICD-10-CM | POA: Diagnosis not present

## 2023-05-01 DIAGNOSIS — M25531 Pain in right wrist: Secondary | ICD-10-CM

## 2023-05-01 NOTE — Patient Instructions (Signed)
Follow up as needed or as scheduled START the Doxycycline twice daily x7 days- take w/ food We'll call you to schedule your hand appt Tylenol or ibuprofen as needed for pain Call with any questions or concerns Hang in there!!
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
## 2023-05-05 ENCOUNTER — Encounter (HOSPITAL_COMMUNITY): Payer: Self-pay | Admitting: Psychiatry

## 2023-05-05 ENCOUNTER — Telehealth (HOSPITAL_COMMUNITY): Payer: Medicare Other | Admitting: Psychiatry

## 2023-05-05 DIAGNOSIS — F331 Major depressive disorder, recurrent, moderate: Secondary | ICD-10-CM | POA: Diagnosis not present

## 2023-05-05 DIAGNOSIS — F0630 Mood disorder due to known physiological condition, unspecified: Secondary | ICD-10-CM

## 2023-05-05 DIAGNOSIS — F411 Generalized anxiety disorder: Secondary | ICD-10-CM | POA: Diagnosis not present
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
## 2023-05-21 ENCOUNTER — Telehealth: Payer: Self-pay | Admitting: Family Medicine

## 2023-05-21 NOTE — Telephone Encounter (Signed)
[facility] - Medical clearance  Charge sheet attached placed in front bin.

## 2023-05-21 NOTE — Telephone Encounter (Signed)
Forms placed in DR Tabori to be signed folder

## 2023-05-22 ENCOUNTER — Encounter (HOSPITAL_BASED_OUTPATIENT_CLINIC_OR_DEPARTMENT_OTHER): Payer: Self-pay | Admitting: Orthopedic Surgery

## 2023-05-22 ENCOUNTER — Other Ambulatory Visit: Payer: Self-pay

## 2023-05-22 ENCOUNTER — Other Ambulatory Visit: Payer: Self-pay | Admitting: Orthopedic Surgery

## 2023-05-23 ENCOUNTER — Ambulatory Visit (HOSPITAL_BASED_OUTPATIENT_CLINIC_OR_DEPARTMENT_OTHER): Payer: Medicare Other | Admitting: Anesthesiology

## 2023-05-23 ENCOUNTER — Ambulatory Visit (HOSPITAL_BASED_OUTPATIENT_CLINIC_OR_DEPARTMENT_OTHER)
Admission: RE | Admit: 2023-05-23 | Discharge: 2023-05-23 | Disposition: A | Discharge status: Home / Self Care | Payer: Medicare Other | Attending: Orthopedic Surgery | Admitting: Orthopedic Surgery

## 2023-05-23 ENCOUNTER — Ambulatory Visit (HOSPITAL_BASED_OUTPATIENT_CLINIC_OR_DEPARTMENT_OTHER): Payer: Medicare Other

## 2023-05-23 ENCOUNTER — Encounter (HOSPITAL_BASED_OUTPATIENT_CLINIC_OR_DEPARTMENT_OTHER): Payer: Self-pay | Admitting: Orthopedic Surgery

## 2023-05-23 ENCOUNTER — Encounter (HOSPITAL_BASED_OUTPATIENT_CLINIC_OR_DEPARTMENT_OTHER)
Admission: RE | Disposition: A | Discharge status: Home / Self Care | Payer: Self-pay | Source: Home / Self Care | Attending: Orthopedic Surgery

## 2023-05-23 DIAGNOSIS — M795 Residual foreign body in soft tissue: Secondary | ICD-10-CM | POA: Diagnosis present

## 2023-05-23 DIAGNOSIS — Z87820 Personal history of traumatic brain injury: Secondary | ICD-10-CM | POA: Insufficient documentation

## 2023-05-23 DIAGNOSIS — K219 Gastro-esophageal reflux disease without esophagitis: Secondary | ICD-10-CM | POA: Diagnosis not present

## 2023-05-23 DIAGNOSIS — S60851D Superficial foreign body of right wrist, subsequent encounter: Secondary | ICD-10-CM | POA: Diagnosis not present

## 2023-05-23 DIAGNOSIS — F319 Bipolar disorder, unspecified: Secondary | ICD-10-CM | POA: Diagnosis not present

## 2023-05-23 DIAGNOSIS — F419 Anxiety disorder, unspecified: Secondary | ICD-10-CM | POA: Insufficient documentation

## 2023-05-23 DIAGNOSIS — Z01818 Encounter for other preprocedural examination: Secondary | ICD-10-CM

## 2023-05-23 HISTORY — DX: Depression, unspecified: F32.A

## 2023-05-23 HISTORY — DX: Anxiety disorder, unspecified: F41.9

## 2023-05-23 HISTORY — PX: MASS EXCISION: SHX2000

## 2023-05-23 HISTORY — DX: Bipolar disorder, unspecified: F31.9

## 2023-05-23 HISTORY — DX: Gastro-esophageal reflux disease without esophagitis: K21.9

## 2023-05-23 LAB — POCT PREGNANCY, URINE: Preg Test, Ur: NEGATIVE

## 2023-05-23 SURGERY — EXCISION MASS
Anesthesia: General | Site: Wrist | Laterality: Right

## 2023-05-23 MED ORDER — MIDAZOLAM HCL 2 MG/2ML IJ SOLN
INTRAMUSCULAR | Status: AC
  Filled 2023-05-23: qty 2

## 2023-05-23 MED ORDER — LIDOCAINE HCL (CARDIAC) PF 100 MG/5ML IV SOSY
PREFILLED_SYRINGE | INTRAVENOUS | Status: DC | PRN
  Administered 2023-05-23: 60 mg via INTRAVENOUS

## 2023-05-23 MED ORDER — CEFAZOLIN SODIUM-DEXTROSE 2-4 GM/100ML-% IV SOLN
2.0000 g | INTRAVENOUS | Status: AC
  Administered 2023-05-23: 2 g via INTRAVENOUS

## 2023-05-23 MED ORDER — FENTANYL CITRATE (PF) 100 MCG/2ML IJ SOLN
INTRAMUSCULAR | Status: DC | PRN
  Administered 2023-05-23 (×2): 50 ug via INTRAVENOUS

## 2023-05-23 MED ORDER — FENTANYL CITRATE (PF) 100 MCG/2ML IJ SOLN
INTRAMUSCULAR | Status: AC
  Filled 2023-05-23: qty 2

## 2023-05-23 MED ORDER — FENTANYL CITRATE (PF) 100 MCG/2ML IJ SOLN: 25.0000 ug | INTRAMUSCULAR | Status: DC | PRN

## 2023-05-23 MED ORDER — ONDANSETRON HCL 4 MG/2ML IJ SOLN
INTRAMUSCULAR | Status: AC
  Filled 2023-05-23: qty 2

## 2023-05-23 MED ORDER — ACETAMINOPHEN 500 MG PO TABS
1000.0000 mg | ORAL_TABLET | Freq: Once | ORAL | Status: AC
  Administered 2023-05-23: 1000 mg via ORAL

## 2023-05-23 MED ORDER — DEXAMETHASONE SODIUM PHOSPHATE 4 MG/ML IJ SOLN
INTRAMUSCULAR | Status: DC | PRN
  Administered 2023-05-23: 4 mg via INTRAVENOUS

## 2023-05-23 MED ORDER — LACTATED RINGERS IV SOLN: INTRAVENOUS | Status: DC

## 2023-05-23 MED ORDER — CEFAZOLIN SODIUM-DEXTROSE 2-4 GM/100ML-% IV SOLN
INTRAVENOUS | Status: AC
  Filled 2023-05-23: qty 100

## 2023-05-23 MED ORDER — PROMETHAZINE HCL 25 MG/ML IJ SOLN: 6.2500 mg | INTRAMUSCULAR | Status: DC | PRN

## 2023-05-23 MED ORDER — HYDROCODONE-ACETAMINOPHEN 5-325 MG PO TABS: ORAL_TABLET | ORAL | 0 refills | Status: DC

## 2023-05-23 MED ORDER — PROPOFOL 10 MG/ML IV BOLUS
INTRAVENOUS | Status: DC | PRN
  Administered 2023-05-23: 200 mg via INTRAVENOUS

## 2023-05-23 MED ORDER — OXYCODONE HCL 5 MG/5ML PO SOLN: 5.0000 mg | Freq: Once | ORAL | Status: DC | PRN

## 2023-05-23 MED ORDER — ACETAMINOPHEN 500 MG PO TABS
ORAL_TABLET | ORAL | Status: AC
  Filled 2023-05-23: qty 2

## 2023-05-23 MED ORDER — OXYCODONE HCL 5 MG PO TABS: 5.0000 mg | ORAL_TABLET | Freq: Once | ORAL | Status: DC | PRN

## 2023-05-23 MED ORDER — MIDAZOLAM HCL 5 MG/5ML IJ SOLN
INTRAMUSCULAR | Status: DC | PRN
  Administered 2023-05-23 (×2): 1 mg via INTRAVENOUS

## 2023-05-23 MED ORDER — BUPIVACAINE HCL (PF) 0.25 % IJ SOLN
INTRAMUSCULAR | Status: AC
  Filled 2023-05-23: qty 30

## 2023-05-23 MED ORDER — PHENYLEPHRINE 80 MCG/ML (10ML) SYRINGE FOR IV PUSH (FOR BLOOD PRESSURE SUPPORT)
PREFILLED_SYRINGE | INTRAVENOUS | Status: AC
  Filled 2023-05-23: qty 10

## 2023-05-23 MED ORDER — SUCCINYLCHOLINE CHLORIDE 200 MG/10ML IV SOSY
PREFILLED_SYRINGE | INTRAVENOUS | Status: AC
  Filled 2023-05-23: qty 10

## 2023-05-23 MED ORDER — EPHEDRINE 5 MG/ML INJ
INTRAVENOUS | Status: AC
  Filled 2023-05-23: qty 5

## 2023-05-23 MED ORDER — LIDOCAINE 2% (20 MG/ML) 5 ML SYRINGE
INTRAMUSCULAR | Status: AC
  Filled 2023-05-23: qty 5

## 2023-05-23 MED ORDER — DEXAMETHASONE SODIUM PHOSPHATE 10 MG/ML IJ SOLN
INTRAMUSCULAR | Status: AC
  Filled 2023-05-23: qty 1

## 2023-05-23 MED ORDER — BUPIVACAINE HCL (PF) 0.25 % IJ SOLN
INTRAMUSCULAR | Status: DC | PRN
  Administered 2023-05-23: 2 mL

## 2023-05-23 SURGICAL SUPPLY — 57 items
APL PRP STRL LF DISP 70% ISPRP (MISCELLANEOUS) ×1
APL SKNCLS STERI-STRIP NONHPOA (GAUZE/BANDAGES/DRESSINGS)
BANDAGE GAUZE 1X75IN STRL (MISCELLANEOUS) IMPLANT
BENZOIN TINCTURE PRP APPL 2/3 (GAUZE/BANDAGES/DRESSINGS) IMPLANT
BLADE MINI RND TIP GREEN BEAV (BLADE) IMPLANT
BLADE SURG 15 STRL LF DISP TIS (BLADE) ×2 IMPLANT
BLADE SURG 15 STRL SS (BLADE) ×2
BNDG CMPR 5X2 CHSV 1 LYR STRL (GAUZE/BANDAGES/DRESSINGS)
BNDG CMPR 5X2 KNTD ELC UNQ LF (GAUZE/BANDAGES/DRESSINGS)
BNDG CMPR 5X3 KNIT ELC UNQ LF (GAUZE/BANDAGES/DRESSINGS) ×1
BNDG CMPR 75X11 PLY HI ABS (MISCELLANEOUS)
BNDG CMPR 75X21 PLY HI ABS (MISCELLANEOUS)
BNDG CMPR 9X4 STRL LF SNTH (GAUZE/BANDAGES/DRESSINGS)
BNDG COHESIVE 1X5 TAN STRL LF (GAUZE/BANDAGES/DRESSINGS) IMPLANT
BNDG COHESIVE 2X5 TAN ST LF (GAUZE/BANDAGES/DRESSINGS) IMPLANT
BNDG ELASTIC 2INX 5YD STR LF (GAUZE/BANDAGES/DRESSINGS) IMPLANT
BNDG ELASTIC 3INX 5YD STR LF (GAUZE/BANDAGES/DRESSINGS) IMPLANT
BNDG ESMARK 4X9 LF (GAUZE/BANDAGES/DRESSINGS) IMPLANT
BNDG GAUZE 1X75IN STRL (MISCELLANEOUS)
BNDG GAUZE DERMACEA FLUFF 4 (GAUZE/BANDAGES/DRESSINGS) IMPLANT
BNDG GZE DERMACEA 4 6PLY (GAUZE/BANDAGES/DRESSINGS)
BNDG PLASTER X FAST 3X3 WHT LF (CAST SUPPLIES) IMPLANT
BNDG PLSTR 9X3 FST ST WHT (CAST SUPPLIES)
CHLORAPREP W/TINT 26 (MISCELLANEOUS) ×1 IMPLANT
CORD BIPOLAR FORCEPS 12FT (ELECTRODE) ×1 IMPLANT
COVER BACK TABLE 60X90IN (DRAPES) ×1 IMPLANT
COVER MAYO STAND STRL (DRAPES) ×1 IMPLANT
CUFF TOURN SGL QUICK 18X4 (TOURNIQUET CUFF) ×1 IMPLANT
DRAPE EXTREMITY T 121X128X90 (DISPOSABLE) ×1 IMPLANT
DRAPE SURG 17X23 STRL (DRAPES) ×1 IMPLANT
GAUZE SPONGE 4X4 12PLY STRL (GAUZE/BANDAGES/DRESSINGS) ×1 IMPLANT
GAUZE STRETCH 2X75IN STRL (MISCELLANEOUS) IMPLANT
GAUZE XEROFORM 1X8 LF (GAUZE/BANDAGES/DRESSINGS) ×1 IMPLANT
GLOVE BIO SURGEON STRL SZ7.5 (GLOVE) ×1 IMPLANT
GLOVE BIOGEL PI IND STRL 8 (GLOVE) ×1 IMPLANT
GOWN STRL REUS W/ TWL LRG LVL3 (GOWN DISPOSABLE) ×1 IMPLANT
GOWN STRL REUS W/TWL LRG LVL3 (GOWN DISPOSABLE) ×1
GOWN STRL REUS W/TWL XL LVL3 (GOWN DISPOSABLE) ×1 IMPLANT
NDL HYPO 25X1 1.5 SAFETY (NEEDLE) ×1 IMPLANT
NEEDLE HYPO 25X1 1.5 SAFETY (NEEDLE) ×1 IMPLANT
NS IRRIG 1000ML POUR BTL (IV SOLUTION) ×1 IMPLANT
PACK BASIN DAY SURGERY FS (CUSTOM PROCEDURE TRAY) ×1 IMPLANT
PAD CAST 3X4 CTTN HI CHSV (CAST SUPPLIES) IMPLANT
PAD CAST 4YDX4 CTTN HI CHSV (CAST SUPPLIES) IMPLANT
PADDING CAST ABS COTTON 4X4 ST (CAST SUPPLIES) ×1 IMPLANT
PADDING CAST COTTON 3X4 STRL (CAST SUPPLIES)
PADDING CAST COTTON 4X4 STRL (CAST SUPPLIES)
STOCKINETTE 4X48 STRL (DRAPES) ×1 IMPLANT
STRIP CLOSURE SKIN 1/2X4 (GAUZE/BANDAGES/DRESSINGS) IMPLANT
SUT ETHILON 3 0 PS 1 (SUTURE) IMPLANT
SUT ETHILON 4 0 PS 2 18 (SUTURE) ×1 IMPLANT
SUT NYLON ETHILON 5-0 P-3 1X18 (SUTURE) IMPLANT
SUT VIC AB 4-0 P2 18 (SUTURE) IMPLANT
SYR BULB EAR ULCER 3OZ GRN STR (SYRINGE) ×1 IMPLANT
SYR CONTROL 10ML LL (SYRINGE) ×1 IMPLANT
TOWEL GREEN STERILE FF (TOWEL DISPOSABLE) ×2 IMPLANT
UNDERPAD 30X36 HEAVY ABSORB (UNDERPADS AND DIAPERS) ×1 IMPLANT

## 2023-05-23 NOTE — Anesthesia Preprocedure Evaluation (Addendum)
Anesthesia Evaluation  Patient identified by MRN, date of birth, ID band Patient awake    Reviewed: Allergy & Precautions, NPO status , Patient's Chart, lab work & pertinent test results  History of Anesthesia Complications Negative for: history of anesthetic complications  Airway Mallampati: I  TM Distance: >3 FB Neck ROM: Full    Dental  (+) Dental Advisory Given, Teeth Intact   Pulmonary   Prev trach 6 yrs ago, decannulated after 1-2 months    Pulmonary exam normal        Cardiovascular negative cardio ROS Normal cardiovascular exam     Neuro/Psych  PSYCHIATRIC DISORDERS Anxiety Depression Bipolar Disorder    TBI     GI/Hepatic ,GERD  Controlled,,(+)     substance abuse  marijuana use  Endo/Other  negative endocrine ROS    Renal/GU negative Renal ROS  Female GU complaint     Musculoskeletal negative musculoskeletal ROS (+)    Abdominal   Peds  Hematology negative hematology ROS (+)   Anesthesia Other Findings   Reproductive/Obstetrics                             Anesthesia Physical Anesthesia Plan  ASA: 2  Anesthesia Plan: General   Post-op Pain Management: Tylenol PO (pre-op)* and Minimal or no pain anticipated   Induction: Intravenous  PONV Risk Score and Plan: 3 and Treatment may vary due to age or medical condition, Ondansetron, Dexamethasone and Midazolam  Airway Management Planned: LMA  Additional Equipment: None  Intra-op Plan:   Post-operative Plan: Extubation in OR  Informed Consent: I have reviewed the patients History and Physical, chart, labs and discussed the procedure including the risks, benefits and alternatives for the proposed anesthesia with the patient or authorized representative who has indicated his/her understanding and acceptance.     Dental advisory given  Plan Discussed with: CRNA and Anesthesiologist  Anesthesia Plan Comments:         Anesthesia Quick Evaluation

## 2023-05-23 NOTE — Anesthesia Procedure Notes (Signed)
Procedure Name: LMA Insertion Date/Time: 05/23/2023 1:44 PM  Performed by: Ronnette Hila, CRNAPre-anesthesia Checklist: Patient identified, Emergency Drugs available, Suction available and Patient being monitored Patient Re-evaluated:Patient Re-evaluated prior to induction Oxygen Delivery Method: Circle system utilized Preoxygenation: Pre-oxygenation with 100% oxygen Induction Type: IV induction Ventilation: Mask ventilation without difficulty LMA: LMA inserted LMA Size: 3.0 Number of attempts: 1 Airway Equipment and Method: Bite block Placement Confirmation: positive ETCO2 Tube secured with: Tape Dental Injury: Teeth and Oropharynx as per pre-operative assessment
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
## 2023-05-23 NOTE — Discharge Instructions (Addendum)
[facility] Instructions Hand Surgery  Wound Care: Keep your hand elevated above the level of your heart.  Do not allow it to dangle by your side.  Keep the dressing dry and do not remove it unless your doctor advises you to do so.  He will usually change it at the time of your post-op visit.  Moving your fingers is advised to stimulate circulation but will depend on the site of your surgery.  If you have a splint applied, your doctor will advise you regarding movement.  Activity: Do not drive or operate machinery today.  Rest today and then you may return to your normal activity and work as indicated by your physician.  Diet:  Drink liquids today or eat a light diet.  You may resume a regular diet tomorrow.    General expectations: Pain for two to three days. Fingers may become slightly swollen.  Call your doctor if any of the following occur: Severe pain not relieved by pain medication. Elevated temperature. Dressing soaked with blood. Inability to move fingers. White or bluish color to fingers.    Post Anesthesia Home Care Instructions  Activity: Get plenty of rest for the remainder of the day. A responsible individual must stay with you for 24 hours following the procedure.  For the next 24 hours, DO NOT: -Drive a car -Advertising copywriter -Drink alcoholic beverages -Take any medication unless instructed by your physician -Make any legal decisions or sign important papers.  Meals: Start with liquid foods such as gelatin or soup. Progress to regular foods as tolerated. Avoid greasy, spicy, heavy foods. If nausea and/or vomiting occur, drink only clear liquids until the nausea and/or vomiting subsides. Call your physician if vomiting continues.  Special Instructions/Symptoms: Your throat may feel dry or sore from the anesthesia or the breathing tube placed in your throat during surgery. If this causes discomfort, gargle with warm salt water. The discomfort should disappear  within 24 hours.  If you had a scopolamine patch placed behind your ear for the management of post- operative nausea and/or vomiting:  1. The medication in the patch is effective for 72 hours, after which it should be removed.  Wrap patch in a tissue and discard in the trash. Wash hands thoroughly with soap and water. 2. You may remove the patch earlier than 72 hours if you experience unpleasant side effects which may include dry mouth, dizziness or visual disturbances. 3. Avoid touching the patch. Wash your hands with soap and water after contact with the patch.    Next Tylenol due after 6pm

## 2023-05-23 NOTE — Anesthesia Postprocedure Evaluation (Signed)
Anesthesia Post Note  Patient: Kylie Osborn  Procedure(s) Performed: EXCISION GLASS FOREIGN BODY RIGHT DORSAL WRIST (Right: Wrist)     Patient location during evaluation: PACU Anesthesia Type: General Level of consciousness: awake and alert Pain management: pain level controlled Vital Signs Assessment: post-procedure vital signs reviewed and stable Respiratory status: spontaneous breathing, nonlabored ventilation and respiratory function stable Cardiovascular status: stable and blood pressure returned to baseline Anesthetic complications: no   No notable events documented.  Last Vitals:  Vitals:   05/23/23 1431 05/23/23 1436  BP: 106/66 105/75  Pulse: 74 (!) 59  Resp: 19 17  Temp:    SpO2: 93% 99%    Last Pain:  Vitals:   05/23/23 1444  TempSrc:   PainSc: 0-No pain                 [name]
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
## 2023-05-23 NOTE — Transfer of Care (Signed)
Immediate Anesthesia Transfer of Care Note  Patient: Kylie Osborn  Procedure(s) Performed: EXCISION GLASS FOREIGN BODY RIGHT DORSAL WRIST (Right: Wrist)  Patient Location: PACU  Anesthesia Type:General  Level of Consciousness: sedated  Airway & Oxygen Therapy: Patient Spontanous Breathing and Patient connected to face mask oxygen  Post-op Assessment: Report given to RN and Post -op Vital signs reviewed and stable  Post vital signs: Reviewed and stable  Last Vitals:  Vitals Value Taken Time  BP    Temp    Pulse    Resp    SpO2      Last Pain:  Vitals:   05/23/23 1209  TempSrc: Oral  PainSc: 0-No pain      Patients Stated Pain Goal: 4 (05/23/23 1209)  Complications: No notable events documented.

## 2023-05-26 ENCOUNTER — Encounter (HOSPITAL_BASED_OUTPATIENT_CLINIC_OR_DEPARTMENT_OTHER): Payer: Self-pay | Admitting: Orthopedic Surgery

## 2023-05-28 NOTE — Telephone Encounter (Signed)
I have the form at my desk

## 2023-05-30 NOTE — Telephone Encounter (Signed)
Per Dr Beverely Low this am pt has had the surgery she shredded the forms

## 2023-09-15 ENCOUNTER — Encounter: Payer: Self-pay | Admitting: Family Medicine

## 2023-09-15 ENCOUNTER — Other Ambulatory Visit (HOSPITAL_COMMUNITY)
Admission: RE | Admit: 2023-09-15 | Discharge: 2023-09-15 | Disposition: A | Discharge status: Home / Self Care | Payer: Medicare Other | Source: Ambulatory Visit | Attending: Family Medicine | Admitting: Family Medicine

## 2023-09-15 ENCOUNTER — Ambulatory Visit (INDEPENDENT_AMBULATORY_CARE_PROVIDER_SITE_OTHER): Payer: Medicare Other | Admitting: Family Medicine

## 2023-09-15 VITALS — BP 108/77 | HR 88 | Temp 98.0°F | Ht 68.0 in | Wt 148.1 lb

## 2023-09-15 DIAGNOSIS — Z202 Contact with and (suspected) exposure to infections with a predominantly sexual mode of transmission: Secondary | ICD-10-CM

## 2023-09-15 DIAGNOSIS — Z0000 Encounter for general adult medical examination without abnormal findings: Secondary | ICD-10-CM | POA: Diagnosis not present

## 2023-09-15 DIAGNOSIS — Z1159 Encounter for screening for other viral diseases: Secondary | ICD-10-CM

## 2023-09-15 DIAGNOSIS — Z114 Encounter for screening for human immunodeficiency virus [HIV]: Secondary | ICD-10-CM

## 2023-09-15 NOTE — Patient Instructions (Addendum)
Follow up in 1 year or as needed We'll notify you of your lab results and make any changes if needed Keep up the good work on healthy diet and regular exercise- you look great! Call with any questions or concerns Stay Safe!  Stay Healthy! Happy Fall!!!

## 2023-09-15 NOTE — Assessment & Plan Note (Signed)
Pt's PE unchanged from previous and WNL.  UTD on pap, Tdap.  Will get STD screening today.  Anticipatory guidance provided.

## 2023-09-15 NOTE — Progress Notes (Signed)
   Subjective:    Patient ID: Kylie Osborn, female    DOB: 1996-08-05, 27 y.o.   MRN: [id number]  HPI CPE- UTD on pap, Tdap  Patient Care Team    Relationship Specialty Notifications Start End  Sheliah Hatch, MD PCP - General   11/07/10      Health Maintenance  Topic Date Due   Hepatitis C Screening  Never done   INFLUENZA VACCINE  06/26/2023   COVID-19 Vaccine (4 - 2023-24 season) 07/27/2023   Medicare Annual Wellness (AWV)  10/17/2023   Cervical Cancer Screening (Pap smear)  12/13/2025   DTaP/Tdap/Td (8 - Td or Tdap) 04/07/2027   HPV VACCINES  Completed   HIV Screening  Completed     Review of Systems Patient reports no vision/ hearing changes, adenopathy,fever, weight change,  persistant/recurrent hoarseness , swallowing issues, chest pain, palpitations, edema, persistant/recurrent cough, hemoptysis, dyspnea (rest/exertional/paroxysmal nocturnal), gastrointestinal bleeding (melena, rectal bleeding), abdominal pain, significant heartburn, bowel changes, GU symptoms (dysuria, hematuria, incontinence), Gyn symptoms (abnormal  bleeding, pain),  syncope, focal weakness, memory loss, numbness & tingling, skin/hair/nail changes, abnormal bruising or bleeding, anxiety, or depression.     Objective:   Physical Exam General Appearance:    Alert, cooperative, no distress, appears stated age  Head:    Normocephalic, without obvious abnormality, atraumatic  Eyes:    PERRL, conjunctiva/corneas clear, EOM's intact both eyes  Ears:    Normal TM's and external ear canals, both ears  Nose:   Nares normal, septum midline, mucosa normal, no drainage    or sinus tenderness  Throat:   Lips, mucosa, and tongue normal; teeth and gums normal  Neck:   Supple, symmetrical, trachea midline, no adenopathy;    Thyroid: no enlargement/tenderness/nodules  Back:     Symmetric, no curvature, ROM normal, no CVA tenderness  Lungs:     Clear to auscultation bilaterally, respirations unlabored  Chest Wall:     No tenderness or deformity   Heart:    Regular rate and rhythm, S1 and S2 normal, no murmur, rub   or gallop  Breast Exam:    Deferred to GYN  Abdomen:     Soft, non-tender, bowel sounds active all four quadrants,    no masses, no organomegaly  Genitalia:    Deferred to GYN  Rectal:    Extremities:   Extremities normal, atraumatic, no cyanosis or edema  Pulses:   2+ and symmetric all extremities  Skin:   Skin color, texture, turgor normal, no rashes or lesions  Lymph nodes:   Cervical, supraclavicular, and axillary nodes normal  Neurologic:   CNII-XII intact, normal strength, sensation and reflexes    throughout          Assessment & Plan:

## 2023-09-16 LAB — HEPATITIS C ANTIBODY: Hepatitis C Ab: NONREACTIVE

## 2023-09-16 LAB — HSV(HERPES SIMPLEX VRS) I + II AB-IGG
HSV 1 IGG,TYPE SPECIFIC AB: <0.9 {index}
HSV 2 IGG,TYPE SPECIFIC AB: <0.9 {index}

## 2023-09-16 LAB — RPR: RPR Ser Ql: NONREACTIVE

## 2023-09-16 LAB — HIV ANTIBODY (ROUTINE TESTING W REFLEX): HIV 1&2 Ab, 4th Generation: NONREACTIVE

## 2023-09-17 ENCOUNTER — Telehealth: Payer: Self-pay

## 2023-09-17 LAB — URINE CYTOLOGY ANCILLARY ONLY
Chlamydia: NEGATIVE
Comment: NEGATIVE
Comment: NORMAL
Neisseria Gonorrhea: NEGATIVE

## 2023-09-17 NOTE — Telephone Encounter (Signed)
-----   Message from Neena Rhymes sent at 09/17/2023 11:58 AM EDT ----- Labs look good!  No evidence of sexually transmitted infections

## 2023-09-17 NOTE — Telephone Encounter (Signed)
Left vm

## 2023-09-17 NOTE — Telephone Encounter (Signed)
Pt returned call

## 2023-09-18 NOTE — Telephone Encounter (Signed)
Seen by patient Kylie Osborn on 09/17/2023  4:49 PM via MyChart

## 2023-09-19 NOTE — Telephone Encounter (Signed)
Pt would a call about results

## 2023-09-19 NOTE — Telephone Encounter (Signed)
Pt reviewed via mychart

## 2023-10-06 ENCOUNTER — Encounter (HOSPITAL_COMMUNITY): Payer: Self-pay | Admitting: Psychiatry

## 2023-10-06 ENCOUNTER — Telehealth (INDEPENDENT_AMBULATORY_CARE_PROVIDER_SITE_OTHER): Payer: Medicare Other | Admitting: Psychiatry

## 2023-10-06 DIAGNOSIS — F331 Major depressive disorder, recurrent, moderate: Secondary | ICD-10-CM

## 2023-10-06 DIAGNOSIS — F411 Generalized anxiety disorder: Secondary | ICD-10-CM

## 2023-10-06 DIAGNOSIS — F0630 Mood disorder due to known physiological condition, unspecified: Secondary | ICD-10-CM
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
## 2023-11-04 ENCOUNTER — Ambulatory Visit (INDEPENDENT_AMBULATORY_CARE_PROVIDER_SITE_OTHER): Payer: Medicare Other | Admitting: *Deleted

## 2023-11-04 DIAGNOSIS — Z0000 Encounter for general adult medical examination without abnormal findings: Secondary | ICD-10-CM

## 2023-11-04 NOTE — Patient Instructions (Addendum)
Ms. Kylie Osborn , Thank you for taking time to come for your Medicare Wellness Visit. I appreciate your ongoing commitment to your health goals. Please review the following plan we discussed and let me know if I can assist you in the future.   Screening recommendations/referrals:  Recommended yearly ophthalmology/optometry visit for glaucoma screening and checkup Recommended yearly dental visit for hygiene and checkup  Vaccinations: Influenza vaccine: Education provided Tdap vaccine: up to date    Advanced directives: Education provided    Preventive Care Female Preventive care refers to lifestyle choices and visits with your health care provider that can promote health and wellness. What does preventive care include? A yearly physical exam. This is also called an annual well check. Dental exams once or twice a year. Routine eye exams. Ask your health care provider how often you should have your eyes checked. Personal lifestyle choices, including: Daily care of your teeth and gums. Regular physical activity. Eating a healthy diet. Avoiding tobacco and drug use. Limiting alcohol use. Practicing safe sex. Taking low-dose aspirin every day. Taking vitamin and mineral supplements as recommended by your health care provider. What happens during an annual well check? The services and screenings done by your health care provider during your annual well check will depend on your age, overall health, lifestyle risk factors, and family history of disease. Counseling  Your health care provider may ask you questions about your: Alcohol use. Tobacco use. Drug use. Emotional well-being. Home and relationship well-being. Sexual activity. Eating habits. History of falls. Memory and ability to understand (cognition). Work and work Astronomer. Reproductive health. Screening  You may have the following tests or measurements: Height, weight, and BMI. Blood pressure. Lipid and cholesterol  levels. These may be checked every 5 years, or more frequently if you are over 96 years old. Skin check. Lung cancer screening. You may have this screening every year starting at age 36 if you have a 30-pack-year history of smoking and currently smoke or have quit within the past 15 years. Fecal occult blood test (FOBT) of the stool. You may have this test every year starting at age 63. Flexible sigmoidoscopy or colonoscopy. You may have a sigmoidoscopy every 5 years or a colonoscopy every 10 years starting at age 37. Hepatitis C blood test. Hepatitis B blood test. Sexually transmitted disease (STD) testing. Diabetes screening. This is done by checking your blood sugar (glucose) after you have not eaten for a while (fasting). You may have this done every 1-3 years. Bone density scan. This is done to screen for osteoporosis. You may have this done starting at age 60. Mammogram. This may be done every 1-2 years. Talk to your health care provider about how often you should have regular mammograms. Talk with your health care provider about your test results, treatment options, and if necessary, the need for more tests. Vaccines  Your health care provider may recommend certain vaccines, such as: Influenza vaccine. This is recommended every year. Tetanus, diphtheria, and acellular pertussis (Tdap, Td) vaccine. You may need a Td booster every 10 years. Zoster vaccine. You may need this after age 46. Pneumococcal 13-valent conjugate (PCV13) vaccine. One dose is recommended after age 7. Pneumococcal polysaccharide (PPSV23) vaccine. One dose is recommended after age 79. Talk to your health care provider about which screenings and vaccines you need and how often you need them. This information is not intended to replace advice given to you by your health care provider. Make sure you discuss any questions you  have with your health care provider. Document Released: 12/08/2015 Document Revised: 07/31/2016  Document Reviewed: 09/12/2015 Elsevier Interactive Patient Education  2017 [employer].  Fall Prevention in the Home Falls can cause injuries. They can happen to people of all ages. There are many things you can do to make your home safe and to help prevent falls. What can I do on the outside of my home? Regularly fix the edges of walkways and driveways and fix any cracks. Remove anything that might make you trip as you walk through a door, such as a raised step or threshold. Trim any bushes or trees on the path to your home. Use bright outdoor lighting. Clear any walking paths of anything that might make someone trip, such as rocks or tools. Regularly check to see if handrails are loose or broken. Make sure that both sides of any steps have handrails. Any raised decks and porches should have guardrails on the edges. Have any leaves, snow, or ice cleared regularly. Use sand or salt on walking paths during winter. Clean up any spills in your garage right away. This includes oil or grease spills. What can I do in the bathroom? Use night lights. Install grab bars by the toilet and in the tub and shower. Do not use towel bars as grab bars. Use non-skid mats or decals in the tub or shower. If you need to sit down in the shower, use a plastic, non-slip stool. Keep the floor dry. Clean up any water that spills on the floor as soon as it happens. Remove soap buildup in the tub or shower regularly. Attach bath mats securely with double-sided non-slip rug tape. Do not have throw rugs and other things on the floor that can make you trip. What can I do in the bedroom? Use night lights. Make sure that you have a light by your bed that is easy to reach. Do not use any sheets or blankets that are too big for your bed. They should not hang down onto the floor. Have a firm chair that has side arms. You can use this for support while you get dressed. Do not have throw rugs and other things on the floor  that can make you trip. What can I do in the kitchen? Clean up any spills right away. Avoid walking on wet floors. Keep items that you use a lot in easy-to-reach places. If you need to reach something above you, use a strong step stool that has a grab bar. Keep electrical cords out of the way. Do not use floor polish or wax that makes floors slippery. If you must use wax, use non-skid floor wax. Do not have throw rugs and other things on the floor that can make you trip. What can I do with my stairs? Do not leave any items on the stairs. Make sure that there are handrails on both sides of the stairs and use them. Fix handrails that are broken or loose. Make sure that handrails are as long as the stairways. Check any carpeting to make sure that it is firmly attached to the stairs. Fix any carpet that is loose or worn. Avoid having throw rugs at the top or bottom of the stairs. If you do have throw rugs, attach them to the floor with carpet tape. Make sure that you have a light switch at the top of the stairs and the bottom of the stairs. If you do not have them, ask someone to add them for you.  What else can I do to help prevent falls? Wear shoes that: Do not have high heels. Have rubber bottoms. Are comfortable and fit you well. Are closed at the toe. Do not wear sandals. If you use a stepladder: Make sure that it is fully opened. Do not climb a closed stepladder. Make sure that both sides of the stepladder are locked into place. Ask someone to hold it for you, if possible. Clearly mark and make sure that you can see: Any grab bars or handrails. First and last steps. Where the edge of each step is. Use tools that help you move around (mobility aids) if they are needed. These include: Canes. Walkers. Scooters. Crutches. Turn on the lights when you go into a dark area. Replace any light bulbs as soon as they burn out. Set up your furniture so you have a clear path. Avoid moving your  furniture around. If any of your floors are uneven, fix them. If there are any pets around you, be aware of where they are. Review your medicines with your doctor. Some medicines can make you feel dizzy. This can increase your chance of falling. Ask your doctor what other things that you can do to help prevent falls. This information is not intended to replace advice given to you by your health care provider. Make sure you discuss any questions you have with your health care provider. Document Released: 09/07/2009 Document Revised: 04/18/2016 Document Reviewed: 12/16/2014 Elsevier Interactive Patient Education  2017 [employer].

## 2023-11-04 NOTE — Progress Notes (Signed)
Subjective:   Kylie Osborn is a 27 y.o. female who presents for Medicare Annual (Subsequent) preventive examination.  Visit Complete: Virtual I connected with  [name] on 11/04/23 by a audio enabled telemedicine application and verified that I am speaking with the correct person using two identifiers.  Patient Location: Home  Provider Location: Home Office  I discussed the limitations of evaluation and management by telemedicine. The patient expressed understanding and agreed to proceed.  Vital Signs: Because this visit was a virtual/telehealth visit, some criteria may be missing or patient reported. Any vitals not documented were not able to be obtained and vitals that have been documented are patient reported.  Patient Medicare AWV questionnaire was completed by the patient on 11-03-2023; I have confirmed that all information answered by patient is correct and no changes since this date.  Cardiac Risk Factors include: advanced age (>38men, >60 women)     Objective:    There were no vitals filed for this visit. There is no height or weight on file to calculate BMI.     11/04/2023   11:07 AM 05/23/2023   12:04 PM 10/16/2022   12:50 PM 08/27/2021   10:26 AM 01/08/2019   10:51 AM 01/20/2018    8:55 AM 01/01/2018   11:05 AM  Advanced Directives  Does Patient Have a Medical Advance Directive? No No No No Yes Yes Yes  Type of Psychologist, occupational Power of Attorney  Does patient want to make changes to medical advance directive?     No - Patient declined    Copy of Healthcare Power of Attorney in Chart?      No - copy requested No - copy requested  Would patient like information on creating a medical advance directive? No - Patient declined No - Patient declined No - Patient declined No - Patient declined       Current Medications (verified) Outpatient Encounter Medications as of 11/04/2023  Medication Sig   aspirin 325 MG tablet Take  325 mg by mouth daily.   baclofen (LIORESAL) 10 MG tablet Take 1 tablet (10 mg total) by mouth 2 (two) times daily.   HYDROcodone-acetaminophen (NORCO/VICODIN) 5-325 MG tablet 1-2 tabs PO q6 hours prn pain   meloxicam (MOBIC) 15 MG tablet TAKE 1 TABLET BY MOUTH EVERY DAY AS NEEDED FOR PAIN   QUEtiapine (SEROQUEL) 50 MG tablet TAKE 1 TABLET BY MOUTH TWICE A DAY   venlafaxine XR (EFFEXOR-XR) 150 MG 24 hr capsule TAKE 1 CAPSULE BY MOUTH DAILY WITH BREAKFAST.   Facility-Administered Encounter Medications as of 11/04/2023  Medication   gadopentetate dimeglumine (MAGNEVIST) injection 20 mL    Allergies (verified) Patient has no known allergies.   History: Past Medical History:  Diagnosis Date   Anxiety    Bipolar 1 disorder (HCC)    Depression    GERD (gastroesophageal reflux disease)    Strep pharyngitis 10/22/2015   TBI (traumatic brain injury) (HCC) 04/06/2017   Past Surgical History:  Procedure Laterality Date   ESOPHAGOGASTRODUODENOSCOPY N/A 04/16/2017   Procedure: ESOPHAGOGASTRODUODENOSCOPY (EGD);  Surgeon: Jimmye Norman, MD;  Location: [facility] ENDOSCOPY;  Service: General;  Laterality: N/A;   LACERATION REPAIR N/A 04/08/2017   Procedure: SCALP LACERATION REPAIR;  Surgeon: Vivia Ewing, DMD;  Location: [facility] OR;  Service: Oral Surgery;  Laterality: N/A;   MASS EXCISION Right 05/23/2023   Procedure: EXCISION GLASS FOREIGN BODY RIGHT DORSAL WRIST;  Surgeon: Betha Loa, MD;  Location: [facility];  Service: Orthopedics;  Laterality: Right;  30 MIN   ORIF ULNAR FRACTURE Left 04/08/2017   Procedure: OPEN REDUCTION INTERNAL FIXATION (ORIF) ULNAR FRACTURE and RADIAL FRACTURE;  Surgeon: [name], MD;  Location: [facility] OR;  Service: Orthopedics;  Laterality: Left;   PEG PLACEMENT N/A 04/16/2017   Procedure: PERCUTANEOUS ENDOSCOPIC GASTROSTOMY (PEG) PLACEMENT;  Surgeon: Jimmye Norman, MD;  Location: [facility] ENDOSCOPY;  Service: General;  Laterality: N/A;   PERCUTANEOUS TRACHEOSTOMY N/A  04/16/2017   Procedure: PERCUTANEOUS TRACHEOSTOMY;  Surgeon: Jimmye Norman, MD;  Location: [facility] OR;  Service: General;  Laterality: N/A;   POSTERIOR CERVICAL FUSION/FORAMINOTOMY N/A 04/06/2017   Procedure: C2- C4 LAMINECTOMY FOR DECOMPRESSION, OPEN REDUCTION OF DISPLACED FRACTURE , C2- C5 POSTERIOR FIXATION FUSION;  Surgeon: [name], [name], MD;  Location: [facility] OR;  Service: Neurosurgery;  Laterality: N/A;   Family History  Problem Relation Age of Onset   Hypertension Mother    Liver disease Father    Diabetes Maternal Grandmother    Hypertension Maternal Grandfather    Social History   Socioeconomic History   Marital status: Single    Spouse name: Not on file   Number of children: 0   Years of education: college student   Highest education level: Not on file  Occupational History   Not on file  Tobacco Use   Smoking status: Never   Smokeless tobacco: Never  Vaping Use   Vaping status: Never Used  Substance and Sexual Activity   Alcohol use: Yes    Alcohol/week: 1.0 standard drink of alcohol    Types: 1 Shots of liquor per week    Comment: occasional use, approximately 1 time a month   Drug use: Yes    Types: Marijuana    Comment: Occasional use, approximately once every 3 months.   Sexual activity: Yes    Partners: Male    Birth control/protection: None  Other Topics Concern   Not on file  Social History Narrative   ** Merged History Encounter **       Lives at home with parents. 1 cup caffeine per day. Ambidextrous.    Social Determinants of Health   Financial Resource Strain: Low Risk  (11/04/2023)   Overall Financial Resource Strain (CARDIA)    Difficulty of Paying Living Expenses: Not very hard  Food Insecurity: Food Insecurity Present (11/04/2023)   Hunger Vital Sign    Worried About Running Out of Food in the Last Year: Sometimes true    Ran Out of Food in the Last Year: Sometimes true  Transportation Needs: No Transportation Needs (11/04/2023)    PRAPARE - Administrator, Civil Service (Medical): No    Lack of Transportation (Non-Medical): No  Physical Activity: Sufficiently Active (11/04/2023)   Exercise Vital Sign    Days of Exercise per Week: 6 days    Minutes of Exercise per Session: 90 min  Stress: Stress Concern Present (11/04/2023)   [employer] of Occupational Health - Occupational Stress Questionnaire    Feeling of Stress : Rather much  Social Connections: Socially Isolated (11/04/2023)   Social Connection and Isolation Panel [NHANES]    Frequency of Communication with Friends and Family: Three times a week    Frequency of Social Gatherings with Friends and Family: Twice a week    Attends Religious Services: Never    Database administrator or Organizations: No    Attends Banker Meetings: Never    Marital Status:  Never married    Tobacco Counseling Counseling given: Not Answered   Clinical Intake:  Pre-visit preparation completed: Yes  Pain : No/denies pain     Diabetes: No  How often do you need to have someone help you when you read instructions, pamphlets, or other written materials from your doctor or pharmacy?: 1 - Never  Interpreter Needed?: No  Information entered by :: Remi Haggard LPN   Activities of Daily Living    11/04/2023   11:08 AM 11/03/2023    5:16 PM  In your present state of health, do you have any difficulty performing the following activities:  Hearing? 0 0  Vision? 0 0  Difficulty concentrating or making decisions? 1 1  Walking or climbing stairs? 0 0  Dressing or bathing? 0 0  Doing errands, shopping? 0 0  Preparing Food and eating ? Y Y  Using the Toilet? N N  In the past six months, have you accidently leaked urine? Y Y  Do you have problems with loss of bowel control? N N  Managing your Medications? N N  Managing your Finances? N N  Housekeeping or managing your Housekeeping? N N    Patient Care Team: Sheliah Hatch, MD as PCP -  General  Indicate any recent Medical Services you may have received from other than [facility] providers in the past year (date may be approximate).     Assessment:   This is a routine wellness examination for Janalynn.  Hearing/Vision screen Hearing Screening - Comments:: No troubel hearing Vision Screening - Comments:: Not up to date   Goals Addressed             This Visit's Progress    Patient Stated       Would like more money saved Be in gym consistently  More Healthy       Depression Screen    11/04/2023   10:57 AM 05/01/2023    9:17 AM 03/12/2023    9:10 AM 12/30/2022   12:38 PM 12/24/2022    9:56 AM 10/16/2022   12:54 PM 10/11/2022   10:53 AM  PHQ 2/9 Scores  PHQ - 2 Score 0 0 0 1 2 1  0  PHQ- 9 Score 3 0 0 2 7 4  0    Fall Risk    11/04/2023   10:54 AM 11/03/2023    5:16 PM 09/15/2023    1:17 PM 05/01/2023    9:17 AM 03/12/2023    9:10 AM  Fall Risk   Falls in the past year? 0 0 0 0 1  Number falls in past yr: 0  0 0 0  Injury with Fall? 0 0 0 0 0  Risk for fall due to :   No Fall Risks No Fall Risks No Fall Risks  Follow up Falls evaluation completed;Education provided;Falls prevention discussed  Falls evaluation completed Falls evaluation completed Falls evaluation completed    MEDICARE RISK AT HOME: Medicare Risk at Home Any stairs in or around the home?: No If so, are there any without handrails?: No Home free of loose throw rugs in walkways, pet beds, electrical cords, etc?: Yes Adequate lighting in your home to reduce risk of falls?: Yes Life alert?: No Use of a cane, walker or w/c?: No Grab bars in the bathroom?: No Shower chair or bench in shower?: No Elevated toilet seat or a handicapped toilet?: No  TIMED UP AND GO:  Was the test performed?  No    Cognitive Function:  11/04/2023   10:56 AM 10/16/2022   12:49 PM  6CIT Screen  What Year? 0 points 0 points  What month? 0 points 0 points  What time? 0 points 0 points  Count back from  20 0 points 0 points  Months in reverse 0 points 0 points  Repeat phrase 2 points 0 points  Total Score 2 points 0 points    Immunizations Immunization History  Administered Date(s) Administered   DTP 04/26/1997, 06/14/1997, 08/16/1997, 02/22/1998, 08/31/2002   H1N1 11/16/2008   HIB (PRP-OMP) 04/26/1997, 06/14/1997, 02/22/1998   HPV Quadrivalent 05/22/2011   Hepatitis B 1995-12-28, 03/07/1997, 08/16/1997   Hpv-Unspecified 11/16/2010, 01/16/2011   Influenza,inj,Quad PF,6+ Mos 10/27/2014, 09/11/2017, 11/20/2018, 09/22/2019, 08/23/2021, 09/09/2022   MMR 02/22/1998, 08/31/2002   Meningococcal Conjugate 06/17/2013   OPV 04/26/1997, 06/14/1997, 02/22/1998, 08/31/2002   PFIZER(Purple Top)SARS-COV-2 Vaccination 03/25/2020, 04/17/2020, 10/30/2020   PPD Test 09/07/2016, 05/18/2019, 09/04/2021   Td 07/15/2008   Tdap 04/06/2017    TDAP status: Up to date  Flu Vaccine status: Due, Education has been provided regarding the importance of this vaccine. Advised may receive this vaccine at local pharmacy or Health Dept. Aware to provide a copy of the vaccination record if obtained from local pharmacy or Health Dept. Verbalized acceptance and understanding.   Covid-19 vaccine status: Declined, Education has been provided regarding the importance of this vaccine but patient still declined. Advised may receive this vaccine at local pharmacy or Health Dept.or vaccine clinic. Aware to provide a copy of the vaccination record if obtained from local pharmacy or Health Dept. Verbalized acceptance and understanding.  Qualifies for Shingles Vaccine? No     Screening Tests Health Maintenance  Topic Date Due   COVID-19 Vaccine (4 - 2023-24 season) 11/20/2023 (Originally 07/27/2023)   INFLUENZA VACCINE  02/23/2024 (Originally 06/26/2023)   Medicare Annual Wellness (AWV)  11/03/2024   Cervical Cancer Screening (Pap smear)  12/13/2025   DTaP/Tdap/Td (8 - Td or Tdap) 04/07/2027   HPV VACCINES  Completed    Hepatitis C Screening  Completed   HIV Screening  Completed    Health Maintenance  There are no preventive care reminders to display for this patient.   Lung Cancer Screening: (Low Dose CT Chest recommended if Age 98-80 years, 20 pack-year currently smoking OR have quit w/in 15years.) does not qualify.   Lung Cancer Screening Referral:   Additional Screening:  Hepatitis C Screening: does not qualify; Completed 2024  Vision Screening: Recommended annual ophthalmology exams for early detection of glaucoma and other disorders of the eye. Is the patient up to date with their annual eye exam?  No  Who is the provider or what is the name of the office in which the patient attends annual eye exams? Education provided If pt is not established with a provider, would they like to be referred to a provider to establish care? No .   Dental Screening: Recommended annual dental exams for proper oral hygiene   Community Resource Referral / Chronic Care Management: CRR required this visit?  No   CCM required this visit?  No     Plan:     I have personally reviewed and noted the following in the patient's chart:   Medical and social history Use of alcohol, tobacco or illicit drugs  Current medications and supplements including opioid prescriptions. Patient is currently taking opioid prescriptions. Information provided to patient regarding non-opioid alternatives. Patient advised to discuss non-opioid treatment plan with their provider. Functional ability and status Nutritional status Physical  activity Advanced directives List of other physicians Hospitalizations, surgeries, and ER visits in previous 12 months Vitals Screenings to include cognitive, depression, and falls Referrals and appointments  In addition, I have reviewed and discussed with patient certain preventive protocols, quality metrics, and best practice recommendations. A written personalized care plan for preventive  services as well as general preventive health recommendations were provided to patient.     Remi Haggard, LPN   [id number]   After Visit Summary: (MyChart) Due to this being a telephonic visit, the after visit summary with patients personalized plan was offered to patient via MyChart   Nurse Notes:

## 2023-11-20 NOTE — Telephone Encounter (Signed)
error 

## 2023-12-03 ENCOUNTER — Other Ambulatory Visit: Payer: Self-pay | Admitting: Family Medicine

## 2023-12-04 NOTE — Telephone Encounter (Signed)
 Requested Prescriptions   Pending Prescriptions Disp Refills   venlafaxine  XR (EFFEXOR -XR) 150 MG 24 hr capsule [Pharmacy Med Name: VENLAFAXINE  HCL ER 150 MG CAP] 90 capsule 2    Sig: TAKE 1 CAPSULE BY MOUTH DAILY WITH BREAKFAST.   baclofen  (LIORESAL ) 10 MG tablet [Pharmacy Med Name: BACLOFEN  10 MG TABLET] 180 tablet 1    Sig: TAKE 1 TABLET BY MOUTH TWICE A DAY   QUEtiapine  (SEROQUEL ) 50 MG tablet [Pharmacy Med Name: QUETIAPINE  FUMARATE 50 MG TAB] 180 tablet 3    Sig: TAKE 1 TABLET BY MOUTH TWICE A DAY     Date of patient request: 12/03/2022 Last office visit: 09/15/2023 Upcoming visit: Visit date not found Date of last refill: 11/11/2022 Last refill amount: 180

## 2024-04-05 ENCOUNTER — Telehealth (HOSPITAL_COMMUNITY): Payer: Medicare Other | Admitting: Psychiatry

## 2024-07-06 ENCOUNTER — Other Ambulatory Visit: Payer: Self-pay

## 2024-07-06 DIAGNOSIS — Z111 Encounter for screening for respiratory tuberculosis: Secondary | ICD-10-CM

## 2024-07-07 ENCOUNTER — Ambulatory Visit: Payer: PRIVATE HEALTH INSURANCE

## 2024-07-07 ENCOUNTER — Other Ambulatory Visit

## 2024-07-07 ENCOUNTER — Telehealth: Payer: Self-pay

## 2024-07-07 DIAGNOSIS — Z111 Encounter for screening for respiratory tuberculosis: Secondary | ICD-10-CM

## 2024-07-07 NOTE — Telephone Encounter (Signed)
 Patient notes we should be receiving or have already received a health form and she has requested that we fax this back to [phone] and OR email this to [email or web address]  and give her a call let her know when it has been completed and sent

## 2024-07-07 NOTE — Telephone Encounter (Signed)
 Pt request 08/2023 AVS sent over to fax number provided

## 2024-07-09 ENCOUNTER — Other Ambulatory Visit: Payer: Self-pay | Admitting: Family Medicine

## 2024-07-09 NOTE — Telephone Encounter (Signed)
 Requested Prescriptions   Pending Prescriptions Disp Refills   baclofen  (LIORESAL ) 10 MG tablet [Pharmacy Med Name: BACLOFEN  10 MG TABLET] 180 tablet 1    Sig: TAKE 1 TABLET BY MOUTH TWICE A DAY     Date of patient request: 07/09/2024 Last office visit: 09/15/2023 Upcoming visit: Visit date not found Date of last refill: 12/04/2023 Last refill amount: 180

## 2024-07-11 LAB — QUANTIFERON-TB GOLD PLUS
Mitogen-NIL: 7.84 [IU]/mL
NIL: 0.02 [IU]/mL
QuantiFERON-TB Gold Plus: NEGATIVE
TB1-NIL: 0 [IU]/mL
TB2-NIL: 0 [IU]/mL

## 2024-07-12 ENCOUNTER — Ambulatory Visit: Payer: Self-pay | Admitting: Family Medicine

## 2024-07-12 NOTE — Progress Notes (Signed)
 Pt has been notified.

## 2024-08-02 NOTE — Telephone Encounter (Signed)
 Kylie Osborn has sent a mychart message to Kylie Osborn letting her know that we did not receive paperwork but we did fax results to the number she gave us .

## 2024-08-02 NOTE — Telephone Encounter (Signed)
 Copied from CRM #[id number]. Topic: Medical Record Request - Records Request >> Jul 30, 2024  4:46 PM [place] J wrote: Reason for CRM: Patient is needing her TB results and health examination certification to be faxed/emailed to her employer as discussed.   If possible, please fax or email to: [phone] / [email or web address]  Please call patient for an update. If she does not answer initially, please leave a voicemail or MyChart message.   Patient's AVS along with TB results has been faxed to the number above. Will also send a MyChart message to let patient know of the status

## 2024-08-02 NOTE — Progress Notes (Signed)
 Please see MyChart message below to patient from [facility] updating patient on form/results

## 2024-08-02 NOTE — Progress Notes (Signed)
 Closing message as it is a duplicate

## 2024-08-02 NOTE — Telephone Encounter (Signed)
 Copied from CRM #[id number]. Topic: Medical Record Request - Records Request >> Jul 30, 2024  4:46 PM [place] J wrote: Reason for CRM: Patient is needing her TB results and health examination certification to be faxed/emailed to her employer as discussed.   If possible, please fax or email to: [phone] / [email or web address]  Please call patient for an update. If she does not answer initially, please leave a voicemail or MyChart message.

## 2024-08-03 NOTE — Telephone Encounter (Signed)
 Noted. Patient is aware and will call us  in two days to let us  know if they have not received results/avs

## 2024-08-03 NOTE — Telephone Encounter (Signed)
 Copied from CRM #[id number]. Topic: Medical Record Request - Records Request >> Aug 03, 2024 11:06 AM Rosina BIRCH wrote: The patient called stating she is returning a call to [facility]. I called [employer] and they told me they have faxed the patient after visit summary to her employer with her TB test. I did inform the patient of this and the office stated to call us  back if they have not received it in two days

## 2024-08-03 NOTE — Telephone Encounter (Signed)
 Called patient and left vm to return call. We have not received a health form. We have faxed AVS to fax number provided. We have also sent her TB results to her employer. We have notified patient of this and she is still stating we need to fill/send health form. Can this be sent again to [phone]?

## 2024-08-06 NOTE — Telephone Encounter (Signed)
 Called patient and left vm to return call.  We have not received any health form from employer nor patient. We have faxed over AVS and TB results a couple days ago.   Does employer not have these results? There are a lot of messages in regards to paperwork and results.   We can fax them over again if we need to but if they need the form as well, they will need to fax it to [phone].

## 2024-08-06 NOTE — Telephone Encounter (Signed)
 Copied from CRM #[id number]. Topic: Medical Record Request - Records Request >> Aug 06, 2024  1:57 PM Robinson DEL wrote: Patient returning call to [facility], patient states employer has not received the fax, patient provided a new fax number to fax information too [phone] fax  [place] [phone]

## 2024-08-06 NOTE — Telephone Encounter (Signed)
 I have faxed and emailed office visit note and lab results to [phone] and [email or web address]

## 2024-08-06 NOTE — Telephone Encounter (Signed)
 Copied from CRM #[id number]. Topic: Medical Record Request - Records Request >> Jul 30, 2024  4:46 PM [place] J wrote: Reason for CRM: Patient is needing her TB results and health examination certification to be faxed/emailed to her employer as discussed.   If possible, please fax or email to: [phone] / [email or web address]  Please call patient for an update. If she does not answer initially, please leave a voicemail or MyChart message. >> Aug 06, 2024  8:13 AM Franky GRADE wrote: Patient is calling to follow up, she needs the health screening form and TB test results to be faxed to her employer. Fax number is [phone].  >> Aug 03, 2024 11:06 AM Rosina BIRCH wrote: The patient called stating she is returning a call to [facility]. I called [employer] and they told me they have faxed the patient after visit summary to her employer with her TB test. I did inform the patient of this and the office stated to call us  back if they have not received it in two days

## 2024-08-11 ENCOUNTER — Telehealth: Payer: Self-pay

## 2024-08-11 NOTE — Telephone Encounter (Unsigned)
 Copied from CRM [id number]. Topic: General - Other >> Aug 11, 2024 12:21 PM Franky GRADE wrote: Reason for CRM: Patient has been communicating with Honor Bern through MyChart, she would like to know if we can send the report of her last physical to [email or web address].

## 2024-08-11 NOTE — Telephone Encounter (Signed)
 Sent to Dr.Tabori as Lorain Childes

## 2024-08-12 NOTE — Telephone Encounter (Signed)
 Pt has been notified in MyChart

## 2024-08-12 NOTE — Telephone Encounter (Signed)
 Form completed and returned to [place]

## 2024-10-07 ENCOUNTER — Other Ambulatory Visit: Payer: Self-pay | Admitting: Family Medicine

## 2024-10-07 ENCOUNTER — Ambulatory Visit: Payer: Self-pay

## 2024-10-07 NOTE — Telephone Encounter (Signed)
 FYI Only or Action Required?: FYI only for provider: appointment scheduled on 10/08/24.  Patient was last seen in primary care on 09/15/2023 by Mahlon Comer BRAVO, MD.  Called Nurse Triage reporting Dental Pain.  Symptoms began a week ago.  Interventions attempted: OTC medications: tylenol  and Rest, hydration, or home remedies.  Symptoms are: gradually worsening.  Triage Disposition: See Physician Within 24 Hours (overriding Call Dentist When Office is Open)  Patient/caregiver understands and will follow disposition?: Yes    Copied from CRM #[id number]. Topic: Clinical - Red Word Triage >> Oct 07, 2024 11:28 AM Laymon HERO wrote: Red Word that prompted transfer to Nurse Triage: Patient wisdom tooth is causing her pain in mouth. Is going to have it extracted next week Reason for Disposition  Toothache present > 24 hours  Answer Assessment - Initial Assessment Questions Additional info: Patient feels her wisdom tooth is infected, severe pain, mild puffiness at gum line. She was seen at urgent care on 10/02/24 but not infected at that time.  She does not have a dentist at this time. Requesting antibiotics. Acute appointment scheduled on 10/08/24    1. LOCATION: Which tooth is hurting?  (e.g., right-side/left-side, upper/lower, front/back)     Right lower wisdom  2. ONSET: When did the toothache start?  (e.g., hours, days)      One week 3. SEVERITY: How bad is the toothache?  (Scale 1-10; mild, moderate or severe)     Severe 4. SWELLING: Is there any visible swelling of your face?     Denies  5. OTHER SYMPTOMS: Do you have any other symptoms? (e.g., fever)      Denies  6. PREGNANCY: Is there any chance you are pregnant? When was your last menstrual period?  Protocols used: Toothache-A-AH

## 2024-10-08 ENCOUNTER — Ambulatory Visit (INDEPENDENT_AMBULATORY_CARE_PROVIDER_SITE_OTHER): Admitting: Student in an Organized Health Care Education/Training Program

## 2024-10-08 ENCOUNTER — Encounter: Payer: Self-pay | Admitting: Student in an Organized Health Care Education/Training Program

## 2024-10-08 VITALS — BP 106/69 | HR 70 | Wt 156.0 lb

## 2024-10-08 DIAGNOSIS — K047 Periapical abscess without sinus: Secondary | ICD-10-CM | POA: Diagnosis not present

## 2024-10-08 MED ORDER — HYDROCODONE-ACETAMINOPHEN 5-325 MG PO TABS
1.0000 | ORAL_TABLET | Freq: Two times a day (BID) | ORAL | 0 refills | Status: AC | PRN
Start: 2024-10-08 — End: 2024-10-13

## 2024-10-08 MED ORDER — AMOXICILLIN 500 MG PO CAPS
500.0000 mg | ORAL_CAPSULE | Freq: Three times a day (TID) | ORAL | 0 refills | Status: AC
Start: 2024-10-08 — End: 2024-10-15

## 2024-10-08 NOTE — Patient Instructions (Signed)
  VISIT SUMMARY: You came in today because of severe pain in your right tooth that has been bothering you for about a week. You have been managing the pain with ibuprofen and hydrocodone , and you are planning to contact an urgent dental service for extraction.  YOUR PLAN: -DENTAL PAIN DUE TO CRACKED, CAVITATED RIGHT LOWER TOOTH WITH DENTAL CARIES: You have severe dental pain from a cracked and decayed tooth. There are no signs of a widespread infection, but the pain is being managed with ibuprofen and hydrocodone . We discussed the risk of the infection spreading. The antibiotics and pain medications are temporary solutions, and you will need dental treatment. I have prescribed amoxicillin  for 7 days and refilled your hydrocodone  prescription for pain management. Please consult with [facility] [place] for further dental care. Call us  if your symptoms get worse.  INSTRUCTIONS: Please follow up with [facility] [place] for dental consultation and treatment. If your symptoms worsen, contact us  immediately.

## 2024-10-08 NOTE — Assessment & Plan Note (Signed)
 Severe dental pain from tooth #32 deep cavity and likely pulp necrosis.  There is also periodontal disease likely contributing.  No systemic infection signs. Pain managed with ibuprofen and hydrocodon has been helpful. Discussed infection spread risk. Antibiotics and pain management are temporary; dental intervention needed. Prescribed amoxicillin  for 7 days and refilled hydrocodone  for pain. Advised dental consultation at [facility] [place]. Instructed to call if symptoms worsen.

## 2024-10-08 NOTE — Progress Notes (Signed)
 Acute Office Visit  Patient ID: Kylie Osborn, female    DOB: 06-Jan-1996, 28 y.o.   MRN: [id number]  PCP: Mahlon Comer BRAVO, MD  Chief Complaint  Patient presents with   Dental Pain    Patient feels her wisdom tooth is infected, severe pain, mild puffiness at gum line. She was seen at urgent care on 10/02/24 but not infected at that time.  She does not have a dentist at this time.No fevers     Subjective:     HPI  Discussed the use of AI scribe software for clinical note transcription with the patient, who gave verbal consent to proceed.  History of Present Illness Kylie Osborn is a 28 year old female who presents with severe right-sided tooth pain.  She has been experiencing severe pain in her right tooth for about a week. Previously, she had a tooth extracted on the left side a few months ago and is now experiencing pain on the right side, for which she is planning to contact an urgent dental service for extraction. She does not have a regular dentist due to lack of insurance and is planning to contact an urgent dental service for extraction.  She has been managing the pain with 800 mg of ibuprofen every six hours and hydrocodone , which was prescribed by urgent care. She has been using hydrocodone  since Saturday, October 02, 2024.  No fever or chills. She is eating and drinking adequately when the pain is not severe.  Her past medical history includes a spinal cord injury in 2018, which has left her disabled. She is currently in school with plans to become a speech-language pathologist.      Objective:    BP 106/69   Pulse 70   Wt 156 lb (70.8 kg)   SpO2 99%   BMI 23.72 kg/m   Physical Exam  Gen: Well-appearing young woman Mouth: Tooth #32 is cracked and has a deep cavity, there is crowding, there is periodontal disease and tenderness around that tooth.  No purulence or drainage.  No trismus. Neck: Mild tenderness at the right side of the neck, but no cervical  adenopathy, no swelling or tenderness of the submandibular space.  Old tracheostomy scar.  Mild thyromegaly.  No nodules.      Assessment & Plan:   Problem List Items Addressed This Visit       Unprioritized   Tooth infection - Primary   Severe dental pain from tooth #32 deep cavity and likely pulp necrosis.  There is also periodontal disease likely contributing.  No systemic infection signs. Pain managed with ibuprofen and hydrocodon has been helpful. Discussed infection spread risk. Antibiotics and pain management are temporary; dental intervention needed. Prescribed amoxicillin  for 7 days and refilled hydrocodone  for pain. Advised dental consultation at [facility] [place]. Instructed to call if symptoms worsen.      Relevant Medications   HYDROcodone -acetaminophen  (NORCO/VICODIN) 5-325 MG tablet   amoxicillin  (AMOXIL ) 500 MG capsule    Meds ordered this encounter  Medications   HYDROcodone -acetaminophen  (NORCO/VICODIN) 5-325 MG tablet    Sig: Take 1 tablet by mouth 2 (two) times daily as needed for up to 5 days for moderate pain (pain score 4-6).    Dispense:  10 tablet    Refill:  0   amoxicillin  (AMOXIL ) 500 MG capsule    Sig: Take 1 capsule (500 mg total) by mouth 3 (three) times daily for 7 days.    Dispense:  21 capsule  Refill:  0    Return if symptoms worsen or fail to improve.  Kylie Debby Specking, MD [facility]

## 2024-11-17 ENCOUNTER — Ambulatory Visit

## 2024-11-17 VITALS — Ht 69.0 in | Wt 156.0 lb

## 2024-11-17 DIAGNOSIS — Z0000 Encounter for general adult medical examination without abnormal findings: Secondary | ICD-10-CM

## 2024-11-17 NOTE — Patient Instructions (Signed)
 Ms. Novak,  Thank you for taking the time for your Medicare Wellness Visit. I appreciate your continued commitment to your health goals. Please review the care plan we discussed, and feel free to reach out if I can assist you further.  Please note that Annual Wellness Visits do not include a physical exam. Some assessments may be limited, especially if the visit was conducted virtually. If needed, we may recommend an in-person follow-up with your provider.  Ongoing Care Seeing your primary care provider every 3 to 6 months helps us  monitor your health and provide consistent, personalized care. Last office visit on 10/08/2024.  You are due for a flu vaccine.  Please call the office to schedule a visit with your primary care provider.  Aim for 30 minutes of exercise or brisk walking, 6-8 glasses of water, and 5 servings of fruits and vegetables each day.   Referrals If a referral was made during today's visit and you haven't received any updates within two weeks, please contact the referred provider directly to check on the status.  Recommended Screenings:  Health Maintenance  Topic Date Due   Flu Shot  06/25/2024   COVID-19 Vaccine (4 - 2025-26 season) 07/26/2024   Medicare Annual Wellness Visit  11/03/2024   Pap Smear  12/13/2025   DTaP/Tdap/Td vaccine (8 - Td or Tdap) 04/07/2027   Hepatitis B Vaccine  Completed   HPV Vaccine  Completed   Hepatitis C Screening  Completed   HIV Screening  Completed   Pneumococcal Vaccine  Aged Out   Meningitis B Vaccine  Aged Out       11/17/2024   11:31 AM  Advanced Directives  Does Patient Have a Medical Advance Directive? No    Vision: Annual vision screenings are recommended for early detection of glaucoma, cataracts, and diabetic retinopathy. These exams can also reveal signs of chronic conditions such as diabetes and high blood pressure.  Dental: Annual dental screenings help detect early signs of oral cancer, gum disease, and other  conditions linked to overall health, including heart disease and diabetes.  Please see the attached documents for additional preventive care recommendations.

## 2024-11-17 NOTE — Progress Notes (Signed)
 "  No chief complaint on file.    Subjective:   Kylie Osborn is a 28 y.o. female who presents for a Medicare Annual Wellness Visit.  Visit info / Clinical Intake: Persons participating in visit and providing information:: patient Medicare Wellness Visit Mode:: Telephone If telephone:: video declined Since this visit was completed virtually, some vitals may be partially provided or unavailable. Missing vitals are due to the limitations of the virtual format.: Documented vitals are patient reported If Telephone or Video please confirm:: I connected with patient using audio/video enable telemedicine. I verified patient identity with two identifiers, discussed telehealth limitations, and patient agreed to proceed. Patient Location:: Home Provider Location:: Home Interpreter Needed?: No Pre-visit prep was completed: yes AWV questionnaire completed by patient prior to visit?: no Living arrangements:: (!) lives alone Patient's Overall Health Status Rating: excellent Typical amount of pain: none Does pain affect daily life?: no Are you currently prescribed opioids?: no  Dietary Habits and Nutritional Risks How many meals a day?: 2 Eats fruit and vegetables daily?: (!) no Most meals are obtained by: having others provide food In the last 2 weeks, have you had any of the following?: none Diabetic:: no  Functional Status Activities of Daily Living (to include ambulation/medication): Independent Ambulation: Independent Medication Administration: Independent Home Management (perform basic housework or laundry): Independent Manage your own finances?: yes Primary transportation is: driving Concerns about vision?: no *vision screening is required for WTM* Concerns about hearing?: no  Fall Screening Falls in the past year?: 1 (fell in bath tube) Number of falls in past year: 0 Was there an injury with Fall?: 0 Fall Risk Category Calculator: 1 Patient Fall Risk Level: Low Fall  Risk  Fall Risk Patient at Risk for Falls Due to: Impaired balance/gait Fall risk Follow up: Falls evaluation completed; Falls prevention discussed  Home and Transportation Safety: All rugs have non-skid backing?: N/A, no rugs All stairs or steps have railings?: yes Grab bars in the bathtub or shower?: (!) no Have non-skid surface in bathtub or shower?: (!) no Good home lighting?: yes Regular seat belt use?: yes Hospital stays in the last year:: no  Cognitive Assessment Difficulty concentrating, remembering, or making decisions? : yes Will 6CIT or Mini Cog be Completed: no 6CIT or Mini Cog Declined: patient alert, oriented, able to answer questions appropriately and recall recent events  Advance Directives (For Healthcare) Does Patient Have a Medical Advance Directive?: No  Reviewed/Updated  Reviewed/Updated: Reviewed All (Medical, Surgical, Family, Medications, Allergies, Care Teams, Patient Goals)    Allergies (verified) Patient has no known allergies.   Current Medications (verified) Outpatient Encounter Medications as of 11/17/2024  Medication Sig   aspirin  325 MG tablet Take 325 mg by mouth daily.   baclofen  (LIORESAL ) 10 MG tablet TAKE 1 TABLET BY MOUTH TWICE A DAY   meloxicam  (MOBIC ) 15 MG tablet TAKE 1 TABLET BY MOUTH EVERY DAY AS NEEDED FOR PAIN   QUEtiapine  (SEROQUEL ) 50 MG tablet TAKE 1 TABLET BY MOUTH TWICE A DAY   venlafaxine  XR (EFFEXOR -XR) 150 MG 24 hr capsule TAKE 1 CAPSULE BY MOUTH DAILY WITH BREAKFAST.   Facility-Administered Encounter Medications as of 11/17/2024  Medication   gadopentetate dimeglumine  (MAGNEVIST ) injection 20 mL    History: Past Medical History:  Diagnosis Date   Anxiety    Bipolar 1 disorder (HCC)    Depression    GERD (gastroesophageal reflux disease)    Strep pharyngitis 10/22/2015   TBI (traumatic brain injury) (HCC) 04/06/2017   Past  Surgical History:  Procedure Laterality Date   ESOPHAGOGASTRODUODENOSCOPY N/A  04/16/2017   Procedure: ESOPHAGOGASTRODUODENOSCOPY (EGD);  Surgeon: Kimble Agent, MD;  Location[facility] ENDOSCOPY;  Service: General;  Laterality: N/A;   LACERATION REPAIR N/A 04/08/2017   Procedure: SCALP LACERATION REPAIR;  Surgeon: Joanette Soulier, DMD;  Location: [facility] OR;  Service: Oral Surgery;  Laterality: N/A;   MASS EXCISION Right 05/23/2023   Procedure: EXCISION GLASS FOREIGN BODY RIGHT DORSAL WRIST;  Surgeon: Murrell Drivers, MD;  Location: [facility];  Service: Orthopedics;  Laterality: Right;  30 MIN   ORIF ULNAR FRACTURE Left 04/08/2017   Procedure: OPEN REDUCTION INTERNAL FIXATION (ORIF) ULNAR FRACTURE and RADIAL FRACTURE;  Surgeon: Beverley Evalene BIRCH, MD;  Location: [facility] OR;  Service: Orthopedics;  Laterality: Left;   PEG PLACEMENT N/A 04/16/2017   Procedure: PERCUTANEOUS ENDOSCOPIC GASTROSTOMY (PEG) PLACEMENT;  Surgeon: Kimble Agent, MD;  Location: [facility] ENDOSCOPY;  Service: General;  Laterality: N/A;   PERCUTANEOUS TRACHEOSTOMY N/A 04/16/2017   Procedure: PERCUTANEOUS TRACHEOSTOMY;  Surgeon: Kimble Agent, MD;  Location: [facility] OR;  Service: General;  Laterality: N/A;   POSTERIOR CERVICAL FUSION/FORAMINOTOMY N/A 04/06/2017   Procedure: C2- C4 LAMINECTOMY FOR DECOMPRESSION, OPEN REDUCTION OF DISPLACED FRACTURE , C2- C5 POSTERIOR FIXATION FUSION;  Surgeon: [name], Morene Hicks, MD;  Location: [facility] OR;  Service: Neurosurgery;  Laterality: N/A;   Family History  Problem Relation Age of Onset   Hypertension Mother    Liver disease Father    Diabetes Maternal Grandmother    Hypertension Maternal Grandfather    Social History   Occupational History   Occupation: Part time [employer]  Tobacco Use   Smoking status: Never   Smokeless tobacco: Never  Vaping Use   Vaping status: Never Used  Substance and Sexual Activity   Alcohol use: Yes    Alcohol/week: 1.0 standard drink of alcohol    Types: 1 Shots of liquor per week    Comment: occasional use, approximately 1 time a month    Drug use: Yes    Types: Marijuana    Comment: Occasional use, approximately once every 3 months.   Sexual activity: Yes    Partners: Male    Birth control/protection: None   Tobacco Counseling Counseling given: Not Answered  SDOH Screenings   Food Insecurity: Food Insecurity Present (11/17/2024)  Housing: High Risk (11/17/2024)  Transportation Needs: No Transportation Needs (11/17/2024)  Utilities: Not At Risk (11/17/2024)  Alcohol Screen: Low Risk (11/04/2023)  Depression (PHQ2-9): Low Risk (11/17/2024)  Financial Resource Strain: Low Risk (11/04/2023)  Physical Activity: Inactive (11/17/2024)  Social Connections: Moderately Integrated (11/17/2024)  Stress: No Stress Concern Present (11/17/2024)  Tobacco Use: Low Risk (11/17/2024)  Health Literacy: Adequate Health Literacy (11/17/2024)   See flowsheets for full screening details  Depression Screen PHQ 2 & 9 Depression Scale- Over the past 2 weeks, how often have you been bothered by any of the following problems? Little interest or pleasure in doing things: 0 Feeling down, depressed, or hopeless (PHQ Adolescent also includes...irritable): 1 PHQ-2 Total Score: 1 Trouble falling or staying asleep, or sleeping too much: 0 Feeling tired or having little energy: 1 Poor appetite or overeating (PHQ Adolescent also includes...weight loss): 0 Feeling bad about yourself - or that you are a failure or have let yourself or your family down: 0 Trouble concentrating on things, such as reading the newspaper or watching television (PHQ Adolescent also includes...like school work): 0 Moving or speaking so slowly that other people could have noticed. Or the opposite -  being so fidgety or restless that you have been moving around a lot more than usual: 0 Thoughts that you would be better off dead, or of hurting yourself in some way: 0 PHQ-9 Total Score: 2 If you checked off any problems, how difficult have these problems made it for you to do  your work, take care of things at home, or get along with other people?: Not difficult at all  Depression Treatment Depression Interventions/Treatment : [id number] Score <4 Follow-up Not Indicated     Goals Addressed   None          Objective:    Today's Vitals   11/17/24 1128  Weight: 156 lb (70.8 kg)  Height: 5' 9 (1.753 m)   Body mass index is 23.04 kg/m.  Hearing/Vision screen Hearing Screening - Comments:: Denies hearing difficulties   Vision Screening - Comments:: Denies vision issues./No provider  Immunizations and Health Maintenance Health Maintenance  Topic Date Due   Influenza Vaccine  06/25/2024   COVID-19 Vaccine (4 - 2025-26 season) 07/26/2024   Medicare Annual Wellness (AWV)  11/17/2025   Cervical Cancer Screening (Pap smear)  12/13/2025   DTaP/Tdap/Td (8 - Td or Tdap) 04/07/2027   Hepatitis B Vaccines 19-59 Average Risk  Completed   HPV VACCINES  Completed   Hepatitis C Screening  Completed   HIV Screening  Completed   Pneumococcal Vaccine  Aged Out   Meningococcal B Vaccine  Aged Out        Assessment/Plan:  This is a routine wellness examination for Genni.  Patient Care Team: Tabori, Katherine E, MD as PCP - General  I have personally reviewed and noted the following in the patients chart:   Medical and social history Use of alcohol, tobacco or illicit drugs  Current medications and supplements including opioid prescriptions. Functional ability and status Nutritional status Physical activity Advanced directives List of other physicians Hospitalizations, surgeries, and ER visits in previous 12 months Vitals Screenings to include cognitive, depression, and falls Referrals and appointments  No orders of the defined types were placed in this encounter.  In addition, I have reviewed and discussed with patient certain preventive protocols, quality metrics, and best practice recommendations. A written personalized care plan for preventive  services as well as general preventive health recommendations were provided to patient.   Latwan Luchsinger L Carolynne Schuchard, CMA   11/17/2024   Return in 1 year (on 11/17/2025).  After Visit Summary: (MyChart) Due to this being a telephonic visit, the after visit summary with patients personalized plan was offered to patient via MyChart   Nurse Notes: Patient is due for a flu vaccine.  She is due for a office visit.  Patient stated that she will call the office to get scheduled.   "

## 2024-12-11 ENCOUNTER — Other Ambulatory Visit: Payer: Self-pay | Admitting: Family Medicine
